# Patient Record
Sex: Male | Born: 1965 | Race: White | Hispanic: No | Marital: Married | Smoking: Never smoker
Health system: Southern US, Community
[De-identification: ages and names within clinical notes are randomized; demographics above are authoritative.]

## PROBLEM LIST (undated history)

## (undated) DIAGNOSIS — E538 Deficiency of other specified B group vitamins: Secondary | ICD-10-CM

## (undated) DIAGNOSIS — I10 Essential (primary) hypertension: Secondary | ICD-10-CM

## (undated) DIAGNOSIS — H348192 Central retinal vein occlusion, unspecified eye, stable: Secondary | ICD-10-CM

## (undated) DIAGNOSIS — R0789 Other chest pain: Secondary | ICD-10-CM

## (undated) DIAGNOSIS — H409 Unspecified glaucoma: Secondary | ICD-10-CM

## (undated) DIAGNOSIS — E042 Nontoxic multinodular goiter: Secondary | ICD-10-CM

## (undated) DIAGNOSIS — D649 Anemia, unspecified: Secondary | ICD-10-CM

## (undated) DIAGNOSIS — R7303 Prediabetes: Secondary | ICD-10-CM

---

## 2003-12-22 HISTORY — PX: COLONOSCOPY: SHX174

## 2006-08-11 ENCOUNTER — Emergency Department: Payer: Self-pay | Admitting: General Practice

## 2008-05-03 ENCOUNTER — Emergency Department: Payer: Self-pay | Admitting: Emergency Medicine

## 2008-05-03 ENCOUNTER — Other Ambulatory Visit: Payer: Self-pay

## 2012-10-18 ENCOUNTER — Other Ambulatory Visit: Payer: Self-pay | Admitting: Ophthalmology

## 2012-11-11 ENCOUNTER — Ambulatory Visit: Payer: Self-pay | Admitting: Oncology

## 2012-11-27 ENCOUNTER — Ambulatory Visit: Payer: Self-pay | Admitting: Oncology

## 2015-12-08 ENCOUNTER — Encounter: Payer: Self-pay | Admitting: Emergency Medicine

## 2015-12-08 ENCOUNTER — Emergency Department
Admission: EM | Admit: 2015-12-08 | Discharge: 2015-12-08 | Disposition: A | Discharge status: Home / Self Care | Payer: BLUE CROSS/BLUE SHIELD | Attending: Emergency Medicine | Admitting: Emergency Medicine

## 2015-12-08 DIAGNOSIS — Y998 Other external cause status: Secondary | ICD-10-CM | POA: Insufficient documentation

## 2015-12-08 DIAGNOSIS — Y9289 Other specified places as the place of occurrence of the external cause: Secondary | ICD-10-CM | POA: Diagnosis not present

## 2015-12-08 DIAGNOSIS — Z23 Encounter for immunization: Secondary | ICD-10-CM | POA: Insufficient documentation

## 2015-12-08 DIAGNOSIS — S61217A Laceration without foreign body of left little finger without damage to nail, initial encounter: Secondary | ICD-10-CM | POA: Insufficient documentation

## 2015-12-08 DIAGNOSIS — I10 Essential (primary) hypertension: Secondary | ICD-10-CM | POA: Diagnosis not present

## 2015-12-08 DIAGNOSIS — Y9389 Activity, other specified: Secondary | ICD-10-CM | POA: Diagnosis not present

## 2015-12-08 DIAGNOSIS — S61219A Laceration without foreign body of unspecified finger without damage to nail, initial encounter: Secondary | ICD-10-CM

## 2015-12-08 HISTORY — DX: Central retinal vein occlusion, unspecified eye, stable: H34.8192

## 2015-12-08 HISTORY — DX: Essential (primary) hypertension: I10

## 2015-12-08 MED ORDER — TETANUS-DIPHTH-ACELL PERTUSSIS 5-2.5-18.5 LF-MCG/0.5 IM SUSP
0.5000 mL | Freq: Once | INTRAMUSCULAR | Status: AC
  Administered 2015-12-08: 0.5 mL via INTRAMUSCULAR
  Filled 2015-12-08: qty 0.5
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
## 2015-12-08 NOTE — Discharge Instructions (Signed)
Laceration Care, Adult °A laceration is a cut that goes through all of the layers of the skin and into the tissue that is right under the skin. Some lacerations heal on their own. Others need to be closed with stitches (sutures), staples, skin adhesive strips, or skin glue. Proper laceration care minimizes the risk of infection and helps the laceration to heal better. °HOW TO CARE FOR YOUR LACERATION °If sutures or staples were used: °· Keep the wound clean and dry. °· If you were given a bandage (dressing), you should change it at least one time per day or as told by your health care provider. You should also change it if it becomes wet or dirty. °· Keep the wound completely dry for the first 24 hours or as told by your health care provider. After that time, you may shower or bathe. However, make sure that the wound is not soaked in water until after the sutures or staples have been removed. °· Clean the wound one time each day or as told by your health care provider: °¨ Wash the wound with soap and water. °¨ Rinse the wound with water to remove all soap. °¨ Pat the wound dry with a clean towel. Do not rub the wound. °· After cleaning the wound, apply a thin layer of antibiotic ointment as told by your health care provider. This will help to prevent infection and keep the dressing from sticking to the wound. °· Have the sutures or staples removed as told by your health care provider. °If skin adhesive strips were used: °· Keep the wound clean and dry. °· If you were given a bandage (dressing), you should change it at least one time per day or as told by your health care provider. You should also change it if it becomes dirty or wet. °· Do not get the skin adhesive strips wet. You may shower or bathe, but be careful to keep the wound dry. °· If the wound gets wet, pat it dry with a clean towel. Do not rub the wound. °· Skin adhesive strips fall off on their own. You may trim the strips as the wound heals. Do not  remove skin adhesive strips that are still stuck to the wound. They will fall off in time. °If skin glue was used: °· Try to keep the wound dry, but you may briefly wet it in the shower or bath. Do not soak the wound in water, such as by swimming. °· After you have showered or bathed, gently pat the wound dry with a clean towel. Do not rub the wound. °· Do not do any activities that will make you sweat heavily until the skin glue has fallen off on its own. °· Do not apply liquid, cream, or ointment medicine to the wound while the skin glue is in place. Using those may loosen the film before the wound has healed. °· If you were given a bandage (dressing), you should change it at least one time per day or as told by your health care provider. You should also change it if it becomes dirty or wet. °· If a dressing is placed over the wound, be careful not to apply tape directly over the skin glue. Doing that may cause the glue to be pulled off before the wound has healed. °· Do not pick at the glue. The skin glue usually remains in place for 5-10 days, then it falls off of the skin. °General Instructions °· Take over-the-counter and prescription   medicines only as told by your health care provider. °· If you were prescribed an antibiotic medicine or ointment, take or apply it as told by your doctor. Do not stop using it even if your condition improves. °· To help prevent scarring, make sure to cover your wound with sunscreen whenever you are outside after stitches are removed, after adhesive strips are removed, or when glue remains in place and the wound is healed. Make sure to wear a sunscreen of at least 30 SPF. °· Do not scratch or pick at the wound. °· Keep all follow-up visits as told by your health care provider. This is important. °· Check your wound every day for signs of infection. Watch for: °¨ Redness, swelling, or pain. °¨ Fluid, blood, or pus. °· Raise (elevate) the injured area above the level of your heart  while you are sitting or lying down, if possible. °SEEK MEDICAL CARE IF: °· You received a tetanus shot and you have swelling, severe pain, redness, or bleeding at the injection site. °· You have a fever. °· A wound that was closed breaks open. °· You notice a bad smell coming from your wound or your dressing. °· You notice something coming out of the wound, such as wood or glass. °· Your pain is not controlled with medicine. °· You have increased redness, swelling, or pain at the site of your wound. °· You have fluid, blood, or pus coming from your wound. °· You notice a change in the color of your skin near your wound. °· You need to change the dressing frequently due to fluid, blood, or pus draining from the wound. °· You develop a new rash. °· You develop numbness around the wound. °SEEK IMMEDIATE MEDICAL CARE IF: °· You develop severe swelling around the wound. °· Your pain suddenly increases and is severe. °· You develop painful lumps near the wound or on skin that is anywhere on your body. °· You have a red streak going away from your wound. °· The wound is on your hand or foot and you cannot properly move a finger or toe. °· The wound is on your hand or foot and you notice that your fingers or toes look pale or bluish. °  °This information is not intended to replace advice given to you by your health care provider. Make sure you discuss any questions you have with your health care provider. °  °Document Released: 10/13/2005 Document Revised: 02/27/2015 Document Reviewed: 10/09/2014 °Elsevier Interactive Patient Education ©2016 [employer] ° °Nonsutured Laceration Care °A laceration is a cut that goes through all layers of the skin and extends into the tissue that is right under the skin. This type of cut is usually stitched up (sutured) or closed with tape (adhesive strips) or skin glue shortly after the injury happens. °However, if the wound is dirty or if several hours pass before medical treatment is  provided, it is likely that germs (bacteria) will enter the wound. Closing a laceration after bacteria have entered it increases the risk of infection. In these cases, your health care provider may leave the laceration open (nonsutured) and cover it with a bandage. This type of treatment helps prevent infection and allows the wound to heal from the deepest layer of tissue damage up to the surface. °An open fracture is a type of injury that may involve nonsutured lacerations. An open fracture is a break in a bone that happens along with one or more lacerations through the skin that   is near the fracture site. °HOW TO CARE FOR YOUR NONSUTURED LACERATION °· Take or apply over-the-counter and prescription medicines only as told by your health care provider. °· If you were prescribed an antibiotic medicine, take or apply it as told by your health care provider. Do not stop using the antibiotic even if your condition improves. °· Clean the wound one time each day or as told by your health care provider. °¨ Wash the wound with mild soap and water. °¨ Rinse the wound with water to remove all soap. °¨ Pat your wound dry with a clean towel. Do not rub the wound. °· Do not inject anything into the wound unless your health care provider told you to. °· Change any bandages (dressings) as told by your health care provider. This includes changing the dressing if it gets wet, dirty, or starts to smell bad. °· Keep the dressing dry until your health care provider says it can be removed. Do not take baths, swim, or do anything that puts your wound underwater until your health care provider approves. °· Raise (elevate) the injured area above the level of your heart while you are sitting or lying down, if possible. °· Do not scratch or pick at the wound. °· Check your wound every day for signs of infection. Watch for: °¨ Redness, swelling, or pain. °¨ Fluid, blood, or pus. °· Keep all follow-up visits as told by your health care  provider. This is important. °SEEK MEDICAL CARE IF: °· You received a tetanus and shot and you have swelling, severe pain, redness, or bleeding at the injection site.   °· You have a fever. °· Your pain is not controlled with medicine. °· You have increased redness, swelling, or pain at the site of your wound. °· You have fluid, blood, or pus coming from your wound. °· You notice a bad smell coming from your wound or your dressing. °· You notice something coming out of the wound, such as wood or glass. °· You notice a change in the color of your skin near your wound. °· You develop a new rash. °· You need to change the dressing frequently due to fluid, blood, or pus draining from the wound. °· You develop numbness around your wound. °SEEK IMMEDIATE MEDICAL CARE IF: °· Your pain suddenly increases and is severe. °· You develop severe swelling around the wound. °· The wound is on your hand or foot and you cannot properly move a finger or toe. °· The wound is on your hand or foot and you notice that your fingers or toes look pale or bluish. °· You have a red streak going away from your wound. °  °This information is not intended to replace advice given to you by your health care provider. Make sure you discuss any questions you have with your health care provider. °  °Document Released: 09/10/2006 Document Revised: 02/27/2015 Document Reviewed: 10/09/2014 °Elsevier Interactive Patient Education ©2016 Elsevier Inc. ° °
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
## 2016-10-08 HISTORY — PX: COLONOSCOPY: SHX174

## 2019-07-26 ENCOUNTER — Ambulatory Visit: Payer: Self-pay | Admitting: Cardiology

## 2019-07-26 ENCOUNTER — Other Ambulatory Visit: Payer: Self-pay

## 2019-09-27 ENCOUNTER — Other Ambulatory Visit: Payer: Self-pay | Admitting: Student

## 2019-09-27 DIAGNOSIS — R17 Unspecified jaundice: Secondary | ICD-10-CM

## 2019-10-10 ENCOUNTER — Ambulatory Visit: Payer: Self-pay

## 2019-11-07 ENCOUNTER — Ambulatory Visit
Admission: RE | Admit: 2019-11-07 | Discharge: 2019-11-07 | Disposition: A | Discharge status: Home / Self Care | Payer: Self-pay | Source: Ambulatory Visit | Attending: Student | Admitting: Student

## 2019-11-07 ENCOUNTER — Other Ambulatory Visit: Payer: Self-pay

## 2019-11-07 DIAGNOSIS — R17 Unspecified jaundice: Secondary | ICD-10-CM | POA: Insufficient documentation

## 2020-02-28 ENCOUNTER — Encounter: Payer: Self-pay | Admitting: Cardiovascular Disease

## 2020-02-28 ENCOUNTER — Other Ambulatory Visit: Payer: Self-pay

## 2020-02-28 ENCOUNTER — Ambulatory Visit (INDEPENDENT_AMBULATORY_CARE_PROVIDER_SITE_OTHER): Payer: 59 | Admitting: Cardiovascular Disease

## 2020-02-28 VITALS — BP 148/100 | HR 78 | Ht 67.0 in | Wt 170.0 lb

## 2020-02-28 DIAGNOSIS — Z720 Tobacco use: Secondary | ICD-10-CM

## 2020-02-28 DIAGNOSIS — R072 Precordial pain: Secondary | ICD-10-CM | POA: Diagnosis not present

## 2020-02-28 DIAGNOSIS — I10 Essential (primary) hypertension: Secondary | ICD-10-CM

## 2020-02-28 MED ORDER — METOPROLOL TARTRATE 100 MG PO TABS
ORAL_TABLET | ORAL | 0 refills | Status: DC
Start: 2020-02-28 — End: 2020-04-06

## 2020-02-28 NOTE — Progress Notes (Signed)
Cardiology Office Note   Date:  02/29/2020   ID:  Peter Stanton, DOB 1966/10/01, MRN [id number]  PCP:  Maryland Pink, MD  Cardiologist:   Kathlyn Sacramento, MD   Chief Complaint  Patient presents with  . OTHER    Chest pain. Meds reviewed verbally with pt.      History of Present Illness: Peter Stanton is a 54 y.o. male who is self-referred for evaluation of chest pain.  He has no prior cardiac history and had previous stress test in 2019 which was normal.  He has known history of essential hypertension that has not been optimally controlled recently.  He is not diabetic.  He does not smoke cigarettes  but does dip tobacco.  He does not know the family history on his father's side.  Over the last 2 years, he has experienced intermittent left-sided chest pain described as pressure and squeezing sensation which can last anywhere from few minutes to few days but is usually mild.  It can happen at rest or with exertion.  No significant shortness of breath.  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  He was seen by a cardiologist (Dr. Pasi) in October of last year at [facility].  CTA of the coronary arteries was ordered.  However, the patient's insurance changed and he could not get the test done.    Past Medical History:  Diagnosis Date  . Hypertension   . Retinal vein occlusion     History reviewed. No pertinent surgical history.   Current Outpatient Medications  Medication Sig Dispense Refill  . amLODipine (NORVASC) 5 MG tablet Take 5 mg by mouth daily.    Marland Kitchen aspirin 81 MG EC tablet Take by mouth daily.     Marland Kitchen EPINEPHrine 0.3 mg/0.3 mL IJ SOAJ injection as needed.    . varenicline (CHANTIX PAK) 0.5 MG X 11 & 1 MG X 42 tablet Follow package directions.    . metoprolol tartrate (LOPRESSOR) 100 MG tablet Take 1 tablet (100 mg) by mouth 2 hours prior to Cardiac CTA 1 tablet 0   No current facility-administered medications for this visit.     Allergies:   Patient has no known allergies.    Social History:  The patient  reports that he has never smoked. His smokeless tobacco use includes chew. He reports current alcohol use. He reports that he does not use drugs.   Family History:  The patient does not no family history on his father's side.  No family history of heart disease on his mother side.   ROS:  Please see the history of present illness.   Otherwise, review of systems are positive for none.   All other systems are reviewed and negative.    PHYSICAL EXAM: VS:  BP (!) 148/100 (BP Location: Right Arm, Patient Position: Sitting, Cuff Size: Normal)   Pulse 78   Ht 5\' 7"  (1.702 m)   Wt 170 lb (77.1 kg)   SpO2 99%   BMI 26.63 kg/m  , BMI Body mass index is 26.63 kg/m. GEN: Well nourished, well developed, in no acute distress  HEENT: normal  Neck: no JVD, carotid bruits, or masses Cardiac: RRR; no murmurs, rubs, or gallops,no edema  Respiratory:  clear to auscultation bilaterally, normal work of breathing GI: soft, nontender, nondistended, + BS MS: no deformity or atrophy  Skin: warm and dry, no rash Neuro:  Strength and sensation are intact Psych: euthymic  mood, full affect   EKG:  EKG is ordered today. The ekg ordered today demonstrates normal sinus rhythm with no significant ST or T wave changes.   Recent Labs: No results found for requested labs within last 8760 hours.    Lipid Panel No results found for: CHOL, TRIG, HDL, CHOLHDL, VLDL, LDLCALC, LDLDIRECT    Wt Readings from Last 3 Encounters:  02/28/20 170 lb (77.1 kg)  12/08/15 165 lb (74.8 kg)       PAD Screen 02/28/2020  Previous PAD dx? No  Previous surgical procedure? No  Pain with walking? No  Feet/toe relief with dangling? No  Painful, non-healing ulcers? No  Extremities discolored? No      ASSESSMENT AND PLAN:  1.  Chest pain: Atypical overall but he does have risk factors including gender, uncontrolled hypertension and  tobacco use.  Fortunately, his EKG does not show any ischemic changes.  I recommend evaluation with CTA of the coronary arteries with FFR if needed.  2.  Essential hypertension: Blood pressure is not controlled on amlodipine 5 mg daily.  He reports being on lisinopril in the past but it was switched to amlodipine.  I suspect that he will require adding a second antihypertensive medication and this will be addressed after his CT result.  3.  Tobacco use: He does not smoke but continues to dip.  Discussed the importance of complete cessation.    Disposition:   FU with me in 1 month  Signed,  Lorine Bears, MD  02/29/2020 4:09 PM    [facility]

## 2020-02-28 NOTE — Patient Instructions (Signed)
Medication Instructions:  Your physician recommends that you continue on your current medications as directed. Please refer to the Current Medication list given to you today.   A one time dose of Metoprolol 100mg  to be taken 2 hours prior to your Cardiac CTA has been sent to your pharmacy.  *If you need a refill on your cardiac medications before your next appointment, please call your pharmacy*   Lab Work: None ordered If you have labs (blood work) drawn today and your tests are completely normal, you will receive your results only by: Marland Kitchen MyChart Message (if you have MyChart) OR . A paper copy in the mail If you have any lab test that is abnormal or we need to change your treatment, we will call you to review the results.   Testing/Procedures: Your physician has requested that you have cardiac CT. Cardiac computed tomography (CT) is a painless test that uses an x-ray machine to take clear, detailed pictures of your heart. For further information please visit [email or web address].[email or web address]. Please follow instruction sheet as given.      Follow-Up: At [facility], you and your health needs are our priority.  As part of our continuing mission to provide you with exceptional heart care, we have created designated Provider Care Teams.  These Care Teams include your primary Cardiologist (physician) and Advanced Practice Providers (APPs -  Physician Assistants and Nurse Practitioners) who all work together to provide you with the care you need, when you need it.  We recommend signing up for the patient portal called "MyChart".  Sign up information is provided on this After Visit Summary.  MyChart is used to connect with patients for Virtual Visits (Telemedicine).  Patients are able to view lab/test results, encounter notes, upcoming appointments, etc.  Non-urgent messages can be sent to your provider as well.   To learn more about what you can do with MyChart, go to [email or web address].    Your  next appointment:   4 week(s)  The format for your next appointment:   In Person  Provider:    You may see Dr. Fletcher Anon or one of the following Advanced Practice Providers on your designated Care Team:    Murray Hodgkins, NP  Christell Faith, PA-C  Marrianne Mood, PA-C    Other Instructions Your cardiac CT will be scheduled at one of the below locations:   [facility] [address] [place], [place] [address] [phone]  [place] [address] [place], [place] [address] [phone]  If scheduled at [facility], please arrive at the [facility] main entrance of [facility] 30 minutes prior to test start time. Proceed to the [facility] Radiology Department (first floor) to check-in and test prep.  If scheduled at [facility], please arrive 15 mins early for check-in and test prep.  Please follow these instructions carefully (unless otherwise directed):  Hold all erectile dysfunction medications at least 3 days (72 hrs) prior to test.  On the Night Before the Test: . Be sure to Drink plenty of water. . Do not consume any caffeinated/decaffeinated beverages or chocolate 12 hours prior to your test. . Do not take any antihistamines 12 hours prior to your test. I On the Day of the Test: . Drink plenty of water. Do not drink any water within one hour of the test. . Do not eat any food 4 hours prior to the test. . You may take your  regular medications prior to the test.  . Take metoprolol (Lopressor) two hours prior to test.        After the Test: . Drink plenty of water. . After receiving IV contrast, you may experience a mild flushed feeling. This is normal. . On occasion, you may experience a mild rash up to 24 hours after the test. This is not dangerous. If this occurs, you can take Benadryl 25 mg and increase your fluid intake. . If you experience trouble  breathing, this can be serious. If it is severe call 911 IMMEDIATELY. If it is mild, please call our office.   Once we have confirmed authorization from your insurance company, we will call you to set up a date and time for your test.   For non-scheduling related questions, please contact the cardiac imaging nurse navigator should you have any questions/concerns: Rockwell Alexandria, RN Navigator Cardiac Imaging [facility] [phone] office  For scheduling needs, including cancellations and rescheduling, please call [phone].

## 2020-03-27 ENCOUNTER — Ambulatory Visit: Payer: 59 | Admitting: Cardiovascular Disease

## 2020-03-28 ENCOUNTER — Telehealth (HOSPITAL_COMMUNITY): Payer: Self-pay | Admitting: *Deleted

## 2020-03-28 NOTE — Telephone Encounter (Signed)
Attempted to call patient regarding upcoming cardiac CT appointment. Left message on voicemail with name and callback number  Wade Asebedo Tai RN Navigator Cardiac Imaging [facility] [phone] Office [phone] Cell  

## 2020-03-29 ENCOUNTER — Ambulatory Visit
Admission: RE | Admit: 2020-03-29 | Discharge: 2020-03-29 | Disposition: A | Discharge status: Home / Self Care | Payer: 59 | Source: Ambulatory Visit | Attending: Cardiovascular Disease | Admitting: Cardiovascular Disease

## 2020-03-29 ENCOUNTER — Other Ambulatory Visit: Payer: Self-pay

## 2020-03-29 DIAGNOSIS — R072 Precordial pain: Secondary | ICD-10-CM | POA: Insufficient documentation

## 2020-03-29 LAB — POCT I-STAT CREATININE: Creatinine, Ser: 1.1 mg/dL (ref 0.61–1.24)

## 2020-03-29 MED ORDER — NITROGLYCERIN 0.4 MG SL SUBL
0.8000 mg | SUBLINGUAL_TABLET | Freq: Once | SUBLINGUAL | Status: AC
  Administered 2020-03-29: 0.8 mg via SUBLINGUAL

## 2020-03-29 MED ORDER — IOHEXOL 350 MG/ML SOLN
75.0000 mL | Freq: Once | INTRAVENOUS | Status: AC | PRN
  Administered 2020-03-29: 75 mL via INTRAVENOUS

## 2020-03-29 MED ORDER — METOPROLOL TARTRATE 5 MG/5ML IV SOLN
10.0000 mg | Freq: Once | INTRAVENOUS | Status: AC
  Administered 2020-03-29: 10 mg via INTRAVENOUS

## 2020-03-29 NOTE — Progress Notes (Signed)
Patient tolerated CT well. Ate peanut butter crackers and drank water after. Ambulatory steady gait to exit.

## 2020-03-30 ENCOUNTER — Telehealth: Payer: Self-pay | Admitting: Cardiovascular Disease

## 2020-03-30 NOTE — Telephone Encounter (Signed)
Returned the call to [facility]. They were calling regarding the patients recent Cardiac CTA results. Advised them that the Radiologist impressions is available for review in the patients charts. I will note and fwd the update to the patient cardiologist Dr. Kirke Corin.   Reticulonodular opacities and patchy airspace opacity in the medial left lower lobe, suspicious for infectious or inflammatory etiology. Recommend clinical correlation, and consider chest radiograph or chest CT for further evaluation.

## 2020-03-30 NOTE — Telephone Encounter (Signed)
[facility] radiology calling to report results ot CT.

## 2020-04-03 NOTE — Telephone Encounter (Signed)
See result note.  

## 2020-04-04 NOTE — Telephone Encounter (Signed)
Patient made aware of [facility] CTA results and Dr. Jari Sportsman recommendation with verbalized understanding.

## 2020-04-04 NOTE — Telephone Encounter (Signed)
Called to give the patient Cardiac CTA. lmtcb.

## 2020-04-04 NOTE — Telephone Encounter (Signed)
-----   Message from Iran Ouch, MD sent at 04/03/2020  5:49 PM EDT ----- Inform patient that cardiac CTA showed no significant coronary artery disease.  This is overall good news.  The CT did show some shadowing in the left lower lung of unclear etiology.  He should keep his follow-up appointment with me this week to see if he has any symptoms related to this.

## 2020-04-04 NOTE — Telephone Encounter (Signed)
Patient returning call.

## 2020-04-06 ENCOUNTER — Encounter: Payer: Self-pay | Admitting: Cardiovascular Disease

## 2020-04-06 ENCOUNTER — Other Ambulatory Visit: Payer: Self-pay

## 2020-04-06 ENCOUNTER — Ambulatory Visit (INDEPENDENT_AMBULATORY_CARE_PROVIDER_SITE_OTHER): Payer: 59 | Admitting: Cardiovascular Disease

## 2020-04-06 VITALS — BP 160/90 | HR 80 | Ht 67.0 in | Wt 168.1 lb

## 2020-04-06 DIAGNOSIS — I10 Essential (primary) hypertension: Secondary | ICD-10-CM

## 2020-04-06 DIAGNOSIS — E785 Hyperlipidemia, unspecified: Secondary | ICD-10-CM

## 2020-04-06 DIAGNOSIS — R072 Precordial pain: Secondary | ICD-10-CM | POA: Diagnosis not present

## 2020-04-06 MED ORDER — LOSARTAN POTASSIUM 50 MG PO TABS
50.0000 mg | ORAL_TABLET | Freq: Every day | ORAL | 6 refills | Status: DC
Start: 2020-04-06 — End: 2020-09-27

## 2020-04-06 NOTE — Progress Notes (Signed)
Cardiology Office Note   Date:  04/06/2020   ID:  Peter Stanton, DOB 09-Jan-1966, MRN [id number]  PCP:  Jerl Mina, MD  Cardiologist:   Lorine Bears, MD   Chief Complaint  Patient presents with  . office visit    Meds verbally reviewed w/ pt.      History of Present Illness: Peter Stanton is a 54 y.o. male who is here today for follow-up with regarding chest pain.  He has no prior cardiac history and had previous stress test in 2019 which was normal.  He has known history of essential hypertension that has not been optimally controlled recently.  He is not diabetic.  He does not smoke cigarettes  but does dip tobacco.  He was seen recently for atypical chest pain.  He underwent CTA of the coronary arteries which showed minimally elevated calcium score of 0.77 with near normal coronary arteries with no evidence of obstructive disease.  The CT did show incidental finding of reticulonodular opacities and patchy airspace disease in the medial left lower lobe suspicious for infectious or inflammatory etiology.  The patient reports that recently he had a bad sinus infection with associated bronchitis.  He had productive cough for about 3 weeks and was placed on a 10-day course of prednisone.  His symptoms resolved completely and currently he has no residual symptoms.    Past Medical History:  Diagnosis Date  . Hypertension   . Retinal vein occlusion     History reviewed. No pertinent surgical history.   Current Outpatient Medications  Medication Sig Dispense Refill  . amLODipine (NORVASC) 5 MG tablet Take 5 mg by mouth daily.    Marland Kitchen aspirin 81 MG EC tablet Take by mouth daily.     Marland Kitchen EPINEPHrine 0.3 mg/0.3 mL IJ SOAJ injection as needed.    . varenicline (CHANTIX PAK) 0.5 MG X 11 & 1 MG X 42 tablet As needed     No current facility-administered medications for this visit.    Allergies:   Other and Meloxicam    Social History:  The patient  reports that he has never smoked.  His smokeless tobacco use includes chew. He reports current alcohol use. He reports that he does not use drugs.   Family History:  The patient does not no family history on his father's side.  No family history of heart disease on his mother side.   ROS:  Please see the history of present illness.   Otherwise, review of systems are positive for none.   All other systems are reviewed and negative.    PHYSICAL EXAM: VS:  BP (!) 160/90 (BP Location: Left Arm, Patient Position: Sitting, Cuff Size: Normal)   Pulse 80   Ht 5\' 7"  (1.702 m)   Wt 168 lb 2 oz (76.3 kg)   SpO2 99%   BMI 26.33 kg/m  , BMI Body mass index is 26.33 kg/m. GEN: Well nourished, well developed, in no acute distress  HEENT: normal  Neck: no JVD, carotid bruits, or masses Cardiac: RRR; no murmurs, rubs, or gallops,no edema  Respiratory:  clear to auscultation bilaterally, normal work of breathing GI: soft, nontender, nondistended, + BS MS: no deformity or atrophy  Skin: warm and dry, no rash Neuro:  Strength and sensation are intact Psych: euthymic mood, full affect   EKG:  EKG is not ordered today.    Recent Labs: 03/29/2020: Creatinine, Ser 1.10    Lipid Panel No results found for: CHOL,  TRIG, HDL, CHOLHDL, VLDL, LDLCALC, LDLDIRECT    Wt Readings from Last 3 Encounters:  04/06/20 168 lb 2 oz (76.3 kg)  02/28/20 170 lb (77.1 kg)  12/08/15 165 lb (74.8 kg)       PAD Screen 02/28/2020  Previous PAD dx? No  Previous surgical procedure? No  Pain with walking? No  Feet/toe relief with dangling? No  Painful, non-healing ulcers? No  Extremities discolored? No      ASSESSMENT AND PLAN:  1.  Chest pain: Likely musculoskeletal.  Recent CTA of the coronary arteries showed near normal arteries with no evidence of obstructive disease.  No further cardiac work-up is recommended.    2.  Essential hypertension: Blood pressure is not controlled on amlodipine 5 mg daily.  He reports being on lisinopril in the  past with no major side effects.  He did have intermittent dry cough for the.  I elected to start him today on losartan 50 mg daily.  His labs from today shows normal renal function and electrolytes.  3.  Tobacco use: He does not smoke but continues to dip.   4.  Mild hyperlipidemia: I reviewed his lipid profile from today which showed an LDL of 136.  His HDL is elevated at 60.  Given that CTA did not show significant coronary artery disease, I do not think medications are needed.  Continue with healthy lifestyle changes.  5.  Abnormal CT lungs: Patchy airspace disease noted in the left lower lobe.  However, by today's physical exam, his lungs are completely clear.  In addition, all his symptoms of bronchitis have resolved after he received prednisone.  I do not think this requires further follow-up but I will forward to Dr. Kary Kos for his opinion.  The patient is going to follow-up with him next week.   Disposition:   FU with me in 6 months  Signed,  Kathlyn Sacramento, MD  04/06/2020 4:35 PM    [facility]

## 2020-04-06 NOTE — Patient Instructions (Signed)
Medication Instructions:  - Your physician has recommended you make the following change in your medication:   1) START losartan 50 mg- take 1 tablet by mouth once daily   *If you need a refill on your cardiac medications before your next appointment, please call your pharmacy*   Lab Work: - none ordered  If you have labs (blood work) drawn today and your tests are completely normal, you will receive your results only by: Marland Kitchen MyChart Message (if you have MyChart) OR . A paper copy in the mail If you have any lab test that is abnormal or we need to change your treatment, we will call you to review the results.   Testing/Procedures: - none ordered   Follow-Up: At [facility], you and your health needs are our priority.  As part of our continuing mission to provide you with exceptional heart care, we have created designated Provider Care Teams.  These Care Teams include your primary Cardiologist (physician) and Advanced Practice Providers (APPs -  Physician Assistants and Nurse Practitioners) who all work together to provide you with the care you need, when you need it.  We recommend signing up for the patient portal called "MyChart".  Sign up information is provided on this After Visit Summary.  MyChart is used to connect with patients for Virtual Visits (Telemedicine).  Patients are able to view lab/test results, encounter notes, upcoming appointments, etc.  Non-urgent messages can be sent to your provider as well.   To learn more about what you can do with MyChart, go to [email or web address].    Your next appointment:   6 month(s)  The format for your next appointment:   In Person  Provider:    You may see Lorine Bears, MD or one of the following Advanced Practice Providers on your designated Care Team:    Nicolasa Ducking, NP  Eula Listen, PA-C  Marisue Ivan, PA-C    Other Instructions n/a

## 2020-04-22 ENCOUNTER — Encounter: Payer: Self-pay | Admitting: Emergency Medicine

## 2020-04-22 ENCOUNTER — Other Ambulatory Visit: Payer: Self-pay

## 2020-04-22 DIAGNOSIS — F17220 Nicotine dependence, chewing tobacco, uncomplicated: Secondary | ICD-10-CM | POA: Insufficient documentation

## 2020-04-22 DIAGNOSIS — I10 Essential (primary) hypertension: Secondary | ICD-10-CM | POA: Insufficient documentation

## 2020-04-22 DIAGNOSIS — Y92482 Bike path as the place of occurrence of the external cause: Secondary | ICD-10-CM | POA: Insufficient documentation

## 2020-04-22 DIAGNOSIS — Y9355 Activity, bike riding: Secondary | ICD-10-CM | POA: Insufficient documentation

## 2020-04-22 DIAGNOSIS — Y998 Other external cause status: Secondary | ICD-10-CM | POA: Diagnosis not present

## 2020-04-22 DIAGNOSIS — Z7982 Long term (current) use of aspirin: Secondary | ICD-10-CM | POA: Diagnosis not present

## 2020-04-22 DIAGNOSIS — Z79899 Other long term (current) drug therapy: Secondary | ICD-10-CM | POA: Diagnosis not present

## 2020-04-22 DIAGNOSIS — S81012A Laceration without foreign body, left knee, initial encounter: Secondary | ICD-10-CM | POA: Insufficient documentation
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
## 2020-04-22 NOTE — ED Notes (Signed)
Patient to stat desk in no acute distress asking about wait time. Patient given update on wait time. Patient verbalizes understanding.  

## 2020-04-23 ENCOUNTER — Emergency Department: Payer: 59

## 2020-04-23 ENCOUNTER — Encounter: Payer: Self-pay | Admitting: Radiology

## 2020-04-23 ENCOUNTER — Emergency Department
Admission: EM | Admit: 2020-04-23 | Discharge: 2020-04-23 | Disposition: A | Discharge status: Home / Self Care | Payer: 59 | Attending: Emergency Medicine | Admitting: Emergency Medicine

## 2020-04-23 DIAGNOSIS — S81012A Laceration without foreign body, left knee, initial encounter: Secondary | ICD-10-CM

## 2020-04-23 MED ORDER — LIDOCAINE-EPINEPHRINE 2 %-1:100000 IJ SOLN
20.0000 mL | Freq: Once | INTRAMUSCULAR | Status: AC
  Administered 2020-04-23: 20 mL via INTRADERMAL
  Filled 2020-04-23: qty 1

## 2020-04-23 MED ORDER — TETANUS-DIPHTH-ACELL PERTUSSIS 5-2.5-18.5 LF-MCG/0.5 IM SUSP
0.5000 mL | Freq: Once | INTRAMUSCULAR | Status: AC
  Administered 2020-04-23: 0.5 mL via INTRAMUSCULAR
  Filled 2020-04-23: qty 0.5
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
## 2020-04-23 NOTE — ED Notes (Signed)
MD at bedside for laceration repair.

## 2020-04-23 NOTE — Discharge Instructions (Addendum)
Patient have your sutures removed in 7 to 10 days.  Keep an eye on the area. If you start to developing any redness he should return to the ER to get antibiotics however at this time there is no signs of any foreign bodies.  X-ray was negative for fracture.

## 2020-09-26 ENCOUNTER — Other Ambulatory Visit: Payer: Self-pay | Admitting: Cardiovascular Disease

## 2020-09-26 NOTE — Telephone Encounter (Signed)
Attempted to schedule no ans no vm  

## 2020-09-26 NOTE — Telephone Encounter (Signed)
Please schedule F/U appointment. Thank you! 

## 2020-09-27 NOTE — Telephone Encounter (Signed)
Patient wants to wait until February to schedule .

## 2020-09-27 NOTE — Telephone Encounter (Signed)
No need for labs.  He had full labs done with his primary care physician in June and I was able to review them.

## 2020-09-27 NOTE — Telephone Encounter (Signed)
Spoke with patient .  He was hesitant to schedule visit .  He stated he was not sure why he would need an appt to have routine bp meds he needs to take everyday.    Discussed with patient POC was to fu in December for ROV.  Also discussed option to call pcp if not wanting to continue with cards.    Patient still hesitant and required further explanation / discussion.   Explained for medications to be prescribed safely sometimes in person exam / labs / ekgs are required for the patients well being .    Patient states explanation makes sense but he would like to see if labs are needed  And if these can be obtained prior to ov  .    Please advise .

## 2020-09-27 NOTE — Telephone Encounter (Signed)
Losartan 50 mg qd refilled #90 R-0.  Msg fwd to Dr. Kirke Corin to advise if lab work is needed prior to the patients next f/u appt.

## 2020-09-27 NOTE — Telephone Encounter (Signed)
LVM for patient to call back. ?

## 2020-12-20 ENCOUNTER — Encounter: Payer: Self-pay | Admitting: Cardiovascular Disease

## 2020-12-20 ENCOUNTER — Other Ambulatory Visit: Payer: Self-pay

## 2020-12-20 ENCOUNTER — Ambulatory Visit (INDEPENDENT_AMBULATORY_CARE_PROVIDER_SITE_OTHER): Payer: 59 | Admitting: Cardiovascular Disease

## 2020-12-20 VITALS — BP 140/90 | HR 76 | Ht 67.0 in | Wt 167.0 lb

## 2020-12-20 DIAGNOSIS — I10 Essential (primary) hypertension: Secondary | ICD-10-CM | POA: Diagnosis not present

## 2020-12-20 DIAGNOSIS — R079 Chest pain, unspecified: Secondary | ICD-10-CM

## 2020-12-20 DIAGNOSIS — Z720 Tobacco use: Secondary | ICD-10-CM

## 2020-12-20 DIAGNOSIS — E785 Hyperlipidemia, unspecified: Secondary | ICD-10-CM

## 2020-12-20 DIAGNOSIS — R9389 Abnormal findings on diagnostic imaging of other specified body structures: Secondary | ICD-10-CM

## 2020-12-20 NOTE — Patient Instructions (Signed)
Medication Instructions:  Your physician recommends that you continue on your current medications as directed. Please refer to the Current Medication list given to you today.  *If you need a refill on your cardiac medications before your next appointment, please call your pharmacy*   Lab Work: None ordered If you have labs (blood work) drawn today and your tests are completely normal, you will receive your results only by: Marland Kitchen MyChart Message (if you have MyChart) OR . A paper copy in the mail If you have any lab test that is abnormal or we need to change your treatment, we will call you to review the results.   Testing/Procedures: Your physician has requested that you have an echocardiogram. Echocardiography is a painless test that uses sound waves to create images of your heart. It provides your doctor with information about the size and shape of your heart and how well your heart's chambers and valves are working. This procedure takes approximately one hour. There are no restrictions for this procedure.  Non-Cardiac CT scanning, (CAT scanning), is a noninvasive, special x-ray that produces cross-sectional images of the body using x-rays and a computer. CT scans help physicians diagnose and treat medical conditions. For some CT exams, a contrast material is used to enhance visibility in the area of the body being studied. CT scans provide greater clarity and reveal more details than regular x-ray exams.  --Please call [phone] to schedule your Chest CT   Follow-Up: At [facility], you and your health needs are our priority.  As part of our continuing mission to provide you with exceptional heart care, we have created designated Provider Care Teams.  These Care Teams include your primary Cardiologist (physician) and Advanced Practice Providers (APPs -  Physician Assistants and Nurse Practitioners) who all work together to provide you with the care you need, when you need it.  We  recommend signing up for the patient portal called "MyChart".  Sign up information is provided on this After Visit Summary.  MyChart is used to connect with patients for Virtual Visits (Telemedicine).  Patients are able to view lab/test results, encounter notes, upcoming appointments, etc.  Non-urgent messages can be sent to your provider as well.   To learn more about what you can do with MyChart, go to [email or web address].    Your next appointment:   6 month(s)  The format for your next appointment:   In Person  Provider:   You may see  Lorine Bears, MD or one of the following Advanced Practice Providers on your designated Care Team:    Nicolasa Ducking, NP  Eula Listen, PA-C  Marisue Ivan, PA-C  Cadence [place], [place]  Gillian Shields, NP    Other Instructions N/A

## 2020-12-20 NOTE — Progress Notes (Signed)
Cardiology Office Note   Date:  12/20/2020   ID:  Peter Stanton, DOB 01-31-1966, MRN [id number]  PCP:  Jerl Mina, MD  Cardiologist:   Lorine Bears, MD   Chief Complaint  Patient presents with  . 6 month follow     "doing well." Medications reviewed by the patient verbally.       History of Present Illness: Peter Stanton is a 55 y.o. male who is here today for follow-up with regarding chest pain.  He has no prior cardiac history and had previous stress test in 2019 which was normal.  He has known history of essential hypertension that has not been optimally controlled recently.  He is not diabetic.  He does not smoke cigarettes  but does dip tobacco.  He was seen last year for atypical chest pain.  He underwent CTA of the coronary arteries in June which showed minimally elevated calcium score of 0.77 with near normal coronary arteries with no evidence of obstructive disease.  The CT did show incidental finding of reticulonodular opacities and patchy airspace disease in the medial left lower lobe suspicious for infectious or inflammatory etiology.    He has been doing reasonably well overall but reports continued symptoms of localized chest pain in the left side. This is not exertional. He continues to exercise on regular basis with no symptoms.   Past Medical History:  Diagnosis Date  . Hypertension   . Retinal vein occlusion     History reviewed. No pertinent surgical history.   Current Outpatient Medications  Medication Sig Dispense Refill  . amLODipine (NORVASC) 5 MG tablet Take 5 mg by mouth daily.    Marland Kitchen aspirin 81 MG EC tablet Take by mouth daily.     Marland Kitchen EPINEPHrine 0.3 mg/0.3 mL IJ SOAJ injection as needed.    Marland Kitchen losartan (COZAAR) 50 MG tablet TAKE 1 TABLET BY MOUTH EVERY DAY 90 tablet 0  . brimonidine (ALPHAGAN) 0.2 % ophthalmic solution 1 drop 2 (two) times daily.    Marland Kitchen latanoprost (XALATAN) 0.005 % ophthalmic solution 1 drop at bedtime.     No current  facility-administered medications for this visit.    Allergies:   Other and Meloxicam    Social History:  The patient  reports that he has never smoked. His smokeless tobacco use includes chew. He reports current alcohol use of about 12.0 standard drinks of alcohol per week. He reports that he does not use drugs.   Family History:  The patient does not no family history on his father's side.  No family history of heart disease on his mother side.   ROS:  Please see the history of present illness.   Otherwise, review of systems are positive for none.   All other systems are reviewed and negative.    PHYSICAL EXAM: VS:  BP 140/90 (BP Location: Left Arm, Patient Position: Sitting, Cuff Size: Normal)   Pulse 76   Ht 5\' 7"  (1.702 m)   Wt 167 lb (75.8 kg)   SpO2 98%   BMI 26.16 kg/m  , BMI Body mass index is 26.16 kg/m. GEN: Well nourished, well developed, in no acute distress  HEENT: normal  Neck: no JVD, carotid bruits, or masses Cardiac: RRR; no murmurs, rubs, or gallops,no edema  Respiratory:  clear to auscultation bilaterally, normal work of breathing GI: soft, nontender, nondistended, + BS MS: no deformity or atrophy  Skin: warm and dry, no rash Neuro:  Strength and sensation are intact  Psych: euthymic mood, full affect   EKG:  EKG is not ordered today.    Recent Labs: 03/29/2020: Creatinine, Ser 1.10    Lipid Panel No results found for: CHOL, TRIG, HDL, CHOLHDL, VLDL, LDLCALC, LDLDIRECT    Wt Readings from Last 3 Encounters:  12/20/20 167 lb (75.8 kg)  04/22/20 165 lb (74.8 kg)  04/06/20 168 lb 2 oz (76.3 kg)       PAD Screen 02/28/2020  Previous PAD dx? No  Previous surgical procedure? No  Pain with walking? No  Feet/toe relief with dangling? No  Painful, non-healing ulcers? No  Extremities discolored? No      ASSESSMENT AND PLAN:  1.  Chest pain: Likely musculoskeletal based on description. However, his symptoms persisted. Given that he had abnormal  CT scan of the lungs in June, I am going to get a follow-up CT chest without contrast. In addition, I requested an echocardiogram to ensure no structural heart abnormalities.  2.  Essential hypertension: Blood pressure improved with addition of losartan. I suggested increasing the dose to 100 mg daily but he prefers to stay on the same dosages for now.  3.  Tobacco use: He does not smoke but continues to dip.   4.  Mild hyperlipidemia: Most recent lipid profile showed an LDL of 136. Continue with healthy lifestyle changes.   Disposition:   FU with me in 6 months  Signed,  Lorine Bears, MD  12/20/2020 11:04 AM    [facility]

## 2020-12-25 ENCOUNTER — Other Ambulatory Visit: Payer: Self-pay | Admitting: Cardiovascular Disease

## 2021-01-01 ENCOUNTER — Ambulatory Visit
Admission: RE | Admit: 2021-01-01 | Discharge: 2021-01-01 | Disposition: A | Discharge status: Home / Self Care | Payer: 59 | Source: Ambulatory Visit | Attending: Cardiovascular Disease | Admitting: Cardiovascular Disease

## 2021-01-01 ENCOUNTER — Other Ambulatory Visit: Payer: Self-pay

## 2021-01-01 DIAGNOSIS — R079 Chest pain, unspecified: Secondary | ICD-10-CM | POA: Diagnosis present

## 2021-01-01 DIAGNOSIS — R9389 Abnormal findings on diagnostic imaging of other specified body structures: Secondary | ICD-10-CM | POA: Diagnosis present

## 2021-01-08 ENCOUNTER — Other Ambulatory Visit: Payer: 59

## 2021-01-24 ENCOUNTER — Ambulatory Visit (INDEPENDENT_AMBULATORY_CARE_PROVIDER_SITE_OTHER): Payer: 59

## 2021-01-24 ENCOUNTER — Other Ambulatory Visit: Payer: Self-pay

## 2021-01-24 DIAGNOSIS — R079 Chest pain, unspecified: Secondary | ICD-10-CM | POA: Diagnosis not present

## 2021-01-24 LAB — ECHOCARDIOGRAM COMPLETE
AR max vel: 1.93 cm2
AV Area VTI: 1.97 cm2
AV Area mean vel: 1.94 cm2
AV Mean grad: 4 mmHg
AV Peak grad: 7.3 mmHg
Ao pk vel: 1.35 m/s
Area-P 1/2: 6.43 cm2
S' Lateral: 3.1 cm

## 2021-02-05 ENCOUNTER — Other Ambulatory Visit: Payer: Self-pay | Admitting: Family Medicine

## 2021-02-05 ENCOUNTER — Other Ambulatory Visit (HOSPITAL_COMMUNITY): Payer: Self-pay | Admitting: Family Medicine

## 2021-02-05 DIAGNOSIS — E041 Nontoxic single thyroid nodule: Secondary | ICD-10-CM

## 2021-02-15 ENCOUNTER — Ambulatory Visit
Admission: RE | Admit: 2021-02-15 | Discharge: 2021-02-15 | Disposition: A | Discharge status: Home / Self Care | Payer: 59 | Source: Ambulatory Visit | Attending: Family Medicine | Admitting: Family Medicine

## 2021-02-15 ENCOUNTER — Other Ambulatory Visit: Payer: Self-pay

## 2021-02-15 DIAGNOSIS — E041 Nontoxic single thyroid nodule: Secondary | ICD-10-CM | POA: Insufficient documentation

## 2021-06-13 IMAGING — CT CT HEART MORP W/ CTA COR W/ SCORE W/ CA W/CM &/OR W/O CM
1 of 14 series · 3 of 20 positions shown, 4 images · non-contrast
Comparison: None.

Addendum:
CLINICAL DATA: chestpain

EXAM:
Cardiac/Coronary  CTA
TECHNIQUE: The patient was scanned on a Siemens Somatoform go.Top scanner.

[Series 44: ms multiphase cta coronary 0.60 · axial · 0.36mm/px · z∈[-1081,-1018]mm · 3 of 2817 slices shown, 4 images]
[im 705/2817  vessel]
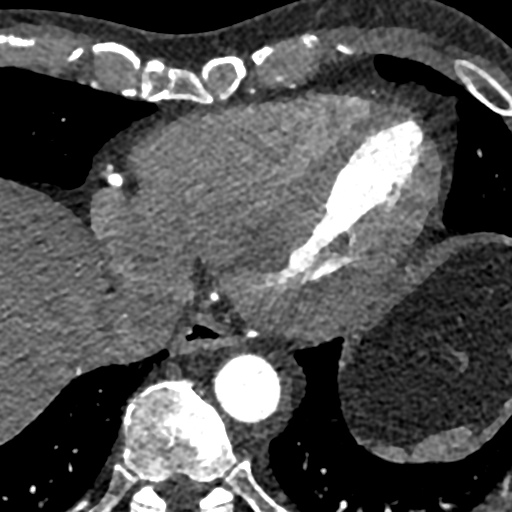
[im 705/2817  lung]
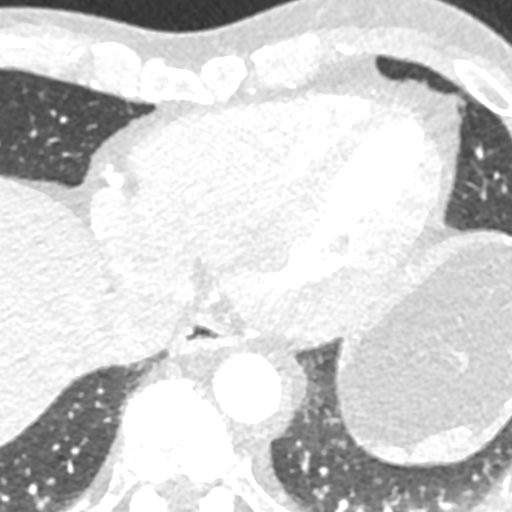
[im 1409/2817  vessel]
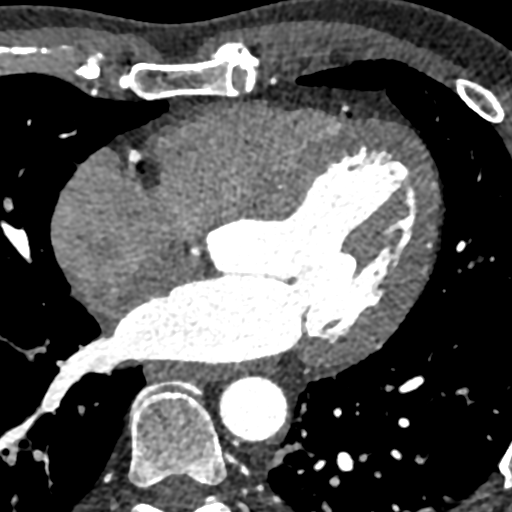
[im 2113/2817  vessel]
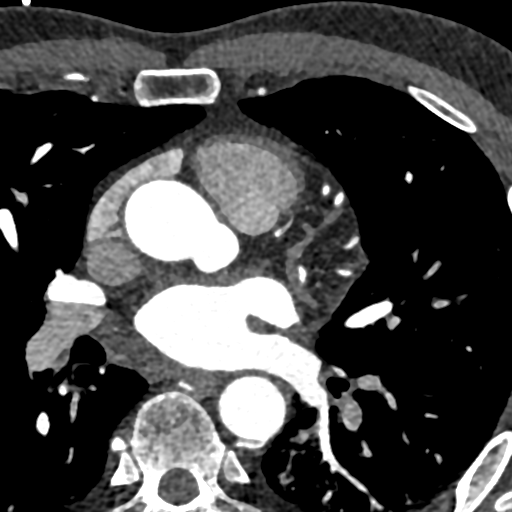

[3 of 20 positions shown; findings below may reference images not displayed]

FINDINGS: A retrospective scan was triggered in the descending thoracic aorta.
Axial non-contrast 3 mm slices were carried out through the heart.
The data set was analyzed on a dedicated work station and scored
using the Agatson method. Gantry rotation speed was 330 msecs and
collimation was .6 mm. 100mg of metoprolol and 0.8 mg of sl NTG was
given. The 3D data set was reconstructed in 5% intervals of the
50-95 % of the R-R cycle. Diastolic phases were analyzed on a
dedicated work station using MPR, MIP and VRT modes. The patient
received 75 cc of contrast.

Aorta: Normal size. Minimal aortic root wall calcifications. No
dissection.

Aortic Valve:  Trileaflet.  No calcifications.

Coronary Arteries:  Normal coronary origin.  Right dominance.

RCA is a large dominant artery that gives rise to PDA and PLA. There
is no plaque.

Left main is a large artery that gives rise to LAD and LCX arteries.

LAD is a large vessel that has no plaque.

LCX is a non-dominant artery that gives rise to 3 obtuse marginal
branches. There is no plaque in the LCx or OM branches.

Other findings:

Normal pulmonary vein drainage into the left atrium.

Normal left atrial appendage without a thrombus.

Normal size of the pulmonary artery.
IMPRESSION: 1. Very low coronary calcium score of 0.77. This was 46th percentile
for age and sex matched control.

2. Normal coronary origin with right dominance.

3. No evidence of obstructive CAD.

4. CAD-RADS 0. Consider non-atherosclerotic causes of chest pain.

EXAM:
OVER-READ INTERPRETATION  CT CHEST

The following report is an over-read performed by radiologist Dr.
does not include interpretation of cardiac or coronary anatomy or
pathology. The coronary calcium score/coronary CTA interpretation by
the cardiologist is attached.
FINDINGS: The visualized portions of the lower lung fields reticulonodular
opacities and patchy airspace opacity in the medial left lower lobe,
suspicious for infectious or inflammatory etiology. No pleural
fluid seen.

The visualized portions of the mediastinum and chest wall are
unremarkable.
IMPRESSION: Reticulonodular opacities and patchy airspace opacity in the medial
left lower lobe, suspicious for infectious or inflammatory etiology.
Recommend clinical correlation, and consider chest radiograph or
chest CT for further evaluation.

These results will be called to the ordering clinician or
representative by the Radiologist Assistant, and communication
documented in the PACS or [REDACTED].

*** End of Addendum ***
FINDINGS: A retrospective scan was triggered in the descending thoracic aorta.
Axial non-contrast 3 mm slices were carried out through the heart.
The data set was analyzed on a dedicated work station and scored
using the Agatson method. Gantry rotation speed was 330 msecs and
collimation was .6 mm. 100mg of metoprolol and 0.8 mg of sl NTG was
given. The 3D data set was reconstructed in 5% intervals of the
50-95 % of the R-R cycle. Diastolic phases were analyzed on a
dedicated work station using MPR, MIP and VRT modes. The patient
received 75 cc of contrast.

Aorta: Normal size. Minimal aortic root wall calcifications. No
dissection.

Aortic Valve:  Trileaflet.  No calcifications.

Coronary Arteries:  Normal coronary origin.  Right dominance.

RCA is a large dominant artery that gives rise to PDA and PLA. There
is no plaque.

Left main is a large artery that gives rise to LAD and LCX arteries.

LAD is a large vessel that has no plaque.

LCX is a non-dominant artery that gives rise to 3 obtuse marginal
branches. There is no plaque in the LCx or OM branches.

Other findings:

Normal pulmonary vein drainage into the left atrium.

Normal left atrial appendage without a thrombus.

Normal size of the pulmonary artery.
IMPRESSION: 1. Very low coronary calcium score of 0.77. This was 46th percentile
for age and sex matched control.

2. Normal coronary origin with right dominance.

3. No evidence of obstructive CAD.

4. CAD-RADS 0. Consider non-atherosclerotic causes of chest pain.

## 2022-01-06 DIAGNOSIS — H401123 Primary open-angle glaucoma, left eye, severe stage: Secondary | ICD-10-CM | POA: Diagnosis not present

## 2022-01-20 DIAGNOSIS — L578 Other skin changes due to chronic exposure to nonionizing radiation: Secondary | ICD-10-CM | POA: Diagnosis not present

## 2022-01-20 DIAGNOSIS — D1801 Hemangioma of skin and subcutaneous tissue: Secondary | ICD-10-CM | POA: Diagnosis not present

## 2022-01-20 DIAGNOSIS — L570 Actinic keratosis: Secondary | ICD-10-CM | POA: Diagnosis not present

## 2022-01-20 DIAGNOSIS — Z86018 Personal history of other benign neoplasm: Secondary | ICD-10-CM | POA: Diagnosis not present

## 2022-01-20 DIAGNOSIS — L738 Other specified follicular disorders: Secondary | ICD-10-CM | POA: Diagnosis not present

## 2022-02-18 DIAGNOSIS — H401123 Primary open-angle glaucoma, left eye, severe stage: Secondary | ICD-10-CM | POA: Diagnosis not present

## 2022-02-18 DIAGNOSIS — H401112 Primary open-angle glaucoma, right eye, moderate stage: Secondary | ICD-10-CM | POA: Diagnosis not present

## 2022-04-10 DIAGNOSIS — I10 Essential (primary) hypertension: Secondary | ICD-10-CM | POA: Diagnosis not present

## 2022-04-10 DIAGNOSIS — G609 Hereditary and idiopathic neuropathy, unspecified: Secondary | ICD-10-CM | POA: Diagnosis not present

## 2022-04-11 DIAGNOSIS — Z0000 Encounter for general adult medical examination without abnormal findings: Secondary | ICD-10-CM | POA: Diagnosis not present

## 2022-04-11 DIAGNOSIS — Z125 Encounter for screening for malignant neoplasm of prostate: Secondary | ICD-10-CM | POA: Diagnosis not present

## 2022-04-11 DIAGNOSIS — G609 Hereditary and idiopathic neuropathy, unspecified: Secondary | ICD-10-CM | POA: Diagnosis not present

## 2022-04-18 DIAGNOSIS — E538 Deficiency of other specified B group vitamins: Secondary | ICD-10-CM | POA: Diagnosis not present

## 2022-05-09 DIAGNOSIS — E538 Deficiency of other specified B group vitamins: Secondary | ICD-10-CM | POA: Diagnosis not present

## 2022-05-16 DIAGNOSIS — E538 Deficiency of other specified B group vitamins: Secondary | ICD-10-CM | POA: Diagnosis not present

## 2022-06-05 DIAGNOSIS — H34811 Central retinal vein occlusion, right eye, with macular edema: Secondary | ICD-10-CM | POA: Diagnosis not present

## 2022-07-31 DIAGNOSIS — R7303 Prediabetes: Secondary | ICD-10-CM | POA: Diagnosis not present

## 2022-07-31 DIAGNOSIS — E538 Deficiency of other specified B group vitamins: Secondary | ICD-10-CM | POA: Diagnosis not present

## 2022-07-31 DIAGNOSIS — I10 Essential (primary) hypertension: Secondary | ICD-10-CM | POA: Diagnosis not present

## 2022-07-31 DIAGNOSIS — K429 Umbilical hernia without obstruction or gangrene: Secondary | ICD-10-CM | POA: Diagnosis not present

## 2022-07-31 DIAGNOSIS — M545 Low back pain, unspecified: Secondary | ICD-10-CM | POA: Diagnosis not present

## 2022-07-31 DIAGNOSIS — Z0000 Encounter for general adult medical examination without abnormal findings: Secondary | ICD-10-CM | POA: Diagnosis not present

## 2022-08-12 ENCOUNTER — Ambulatory Visit: Payer: Self-pay | Admitting: Surgery

## 2022-08-12 DIAGNOSIS — K429 Umbilical hernia without obstruction or gangrene: Secondary | ICD-10-CM | POA: Diagnosis not present

## 2022-08-12 NOTE — H&P (Signed)
Subjective:    CC: Umbilical hernia without obstruction and without gangrene [K42.9]   HPI:  Peter Stanton. is a 56 y.o. male who was referred by Lovie Macadamia, MD for evaluation of above. Aysmptomatic but increasing in size   Past Medical History:  has a past medical history of Glaucoma (increased eye pressure) and Hypertension.   Past Surgical History:       Past Surgical History:  Procedure Laterality Date   COLONOSCOPY   12/22/2003    Dr. Ivor Messier - Nml   COLONOSCOPY   10/08/2016    Entire examined colon is normal/Repeat 50yr/MUS      Family History: family history includes Hyperlipidemia (Elevated cholesterol) in his mother.   Social History:  reports that he has never smoked. He uses smokeless tobacco. He reports current alcohol use. He reports that he does not use drugs.   Current Medications: has a current medication list which includes the following prescription(s): amlodipine, aspirin, brimonidine, latanoprost, and losartan.   Allergies:       Allergies as of 08/12/2022 - Reviewed 07/31/2022  Allergen Reaction Noted   Bee sting kit Hives 08/12/2022   Meloxicam Hives and Itching 08/22/2019      ROS:  A 15 point review of systems was performed and pertinent positives and negatives noted in HPI   Objective:    BP (!) 148/92   Pulse 80   Ht 168.9 cm (5' 6.5")   Wt 74.8 kg (165 lb)   BMI 26.23 kg/m    Constitutional :  Alert, cooperative, no distress  Lymphatics/Throat:  Supple, no lymphadenopathy  Respiratory:  clear to auscultation bilaterally  Cardiovascular:  regular rate and rhythm  Gastrointestinal: soft, non-tender; bowel sounds normal; no masses,  no organomegaly. umbilical hernia noted.  small, reducible, and no overlying skin changes  Musculoskeletal: Steady gait and movement  Skin: Cool and moist, no surgical scars  Psychiatric: Normal affect, non-agitated, not confused         LABS:  N/a    RADS: N/a Assessment:        Umbilical  hernia without obstruction and without gangrene [K42.9]   Plan:    1. Umbilical hernia without obstruction and without gangrene [K42.9]   Discussed the risk of surgery including recurrence, which can be up to 50% in the case of incisional or complex hernias, possible use of prosthetic materials (mesh) and the increased risk of mesh infxn if used, bleeding, chronic pain, post-op infxn, post-op SBO or ileus, and possible re-operation to address said risks. The risks of general anesthetic, if used, includes MI, CVA, sudden death or even reaction to anesthetic medications also discussed. Alternatives include continued observation.  Benefits include possible symptom relief, prevention of incarceration, strangulation, enlargement in size over time, and the risk of emergency surgery in the face of strangulation.    Typical post-op recovery time of 3-5 days with 2 weeks of activity restrictions were also discussed.   ED return precautions given for sudden increase in pain, size of hernia with accompanying fever, nausea, and/or vomiting.   The patient verbalized understanding and all questions were answered to the patient's satisfaction.     2. Patient has elected to proceed with surgical treatment. Procedure will be scheduled. Open due to small size   labs/images/medications/previous chart entries reviewed personally and relevant changes/updates noted above.

## 2022-08-14 DIAGNOSIS — E042 Nontoxic multinodular goiter: Secondary | ICD-10-CM | POA: Diagnosis not present

## 2022-08-15 ENCOUNTER — Inpatient Hospital Stay
Admission: RE | Admit: 2022-08-15 | Discharge: 2022-08-15 | Disposition: A | Discharge status: Home / Self Care | Payer: Self-pay | Source: Ambulatory Visit

## 2022-08-15 NOTE — Pre-Procedure Instructions (Signed)
Patient voiced to me that he does not wish to proceed with this procedure at this time. Dr. Lysle Pearl made aware via secure chat.

## 2022-08-15 NOTE — Patient Instructions (Signed)
Your procedure is scheduled on: 08/22/22 - Friday Report to the Registration Desk on the 1st floor of the [facility]. To find out your arrival time, please call [phone] between 1PM - 3PM on: 08/21/22 - Thursday If your arrival time is 6:00 am, do not arrive prior to that time as the [facility] entrance doors do not open until 6:00 am.  REMEMBER: Instructions that are not followed completely may result in serious medical risk, up to and including death; or upon the discretion of your surgeon and anesthesiologist your surgery may need to be rescheduled.  Do not eat food after midnight the night before surgery.  No gum chewing, lozengers or hard candies.  You may however, drink CLEAR liquids up to 2 hours before you are scheduled to arrive for your surgery. Do not drink anything within 2 hours of your scheduled arrival time.  Clear liquids include: - water  - apple juice without pulp - gatorade (not RED colors) - black coffee or tea (Do NOT add milk or creamers to the coffee or tea) Do NOT drink anything that is not on this list.  TAKE THESE MEDICATIONS THE MORNING OF SURGERY WITH A SIP OF WATER:  - amLODipine (NORVASC) - brimonidine (ALPHAGAN)   One week prior to surgery: Stop Anti-inflammatories (NSAIDS) such as Advil, Aleve, Ibuprofen, Motrin, Naproxen, Naprosyn and Aspirin based products such as Excedrin, Goodys Powder, BC Powder. Stop ANY OVER THE COUNTER supplements until after surgery. You may however, continue to take Tylenol if needed for pain up until the day of surgery.  No Alcohol for 24 hours before or after surgery.  No Smoking including e-cigarettes for 24 hours prior to surgery.  No chewable tobacco products for at least 6 hours prior to surgery.  No nicotine patches on the day of surgery.  Do not use any "recreational" drugs for at least a week prior to your surgery.  Please be advised that the combination of cocaine and anesthesia may have negative  outcomes, up to and including death. If you test positive for cocaine, your surgery will be cancelled.  On the morning of surgery brush your teeth with toothpaste and water, you may rinse your mouth with mouthwash if you wish. Do not swallow any toothpaste or mouthwash.  Use CHG Soap or wipes as directed on instruction sheet.  Do not wear jewelry, make-up, hairpins, clips or nail polish.  Do not wear lotions, powders, or perfumes.   Do not shave body from the neck down 48 hours prior to surgery just in case you cut yourself which could leave a site for infection.  Also, freshly shaved skin may become irritated if using the CHG soap.  Contact lenses, hearing aids and dentures may not be worn into surgery.  Do not bring valuables to the hospital. [facility] is not responsible for any missing/lost belongings or valuables.   Notify your doctor if there is any change in your medical condition (cold, fever, infection).  Wear comfortable clothing (specific to your surgery type) to the hospital.  After surgery, you can help prevent lung complications by doing breathing exercises.  Take deep breaths and cough every 1-2 hours. Your doctor may order a device called an Incentive Spirometer to help you take deep breaths. When coughing or sneezing, hold a pillow firmly against your incision with both hands. This is called "splinting." Doing this helps protect your incision. It also decreases belly discomfort.  If you are being admitted to the hospital overnight, leave  your suitcase in the car. After surgery it may be brought to your room.  If you are being discharged the day of surgery, you will not be allowed to drive home. You will need a responsible adult (18 years or older) to drive you home and stay with you that night.   If you are taking public transportation, you will need to have a responsible adult (18 years or older) with you. Please confirm with your physician that it is acceptable  to use public transportation.   Please call the [facility] at [phone] if you have any questions about these instructions.  Surgery Visitation Policy:  Patients undergoing a surgery or procedure may have two family members or support persons with them as long as the person is not COVID-19 positive or experiencing its symptoms.   Inpatient Visitation:    Visiting hours are 7 a.m. to 8 p.m. Up to four visitors are allowed at one time in a patient room, including children. The visitors may rotate out with other people during the day. One designated support person (adult) may remain overnight.

## 2022-08-21 DIAGNOSIS — E042 Nontoxic multinodular goiter: Secondary | ICD-10-CM | POA: Diagnosis not present

## 2022-08-22 ENCOUNTER — Encounter: Admission: RE | Payer: Self-pay | Source: Home / Self Care

## 2022-08-22 ENCOUNTER — Ambulatory Visit: Admission: RE | Admit: 2022-08-22 | Payer: 59 | Source: Home / Self Care | Admitting: Surgery

## 2022-08-22 SURGERY — REPAIR, HERNIA, UMBILICAL, ADULT
Anesthesia: General

## 2022-08-25 DIAGNOSIS — E042 Nontoxic multinodular goiter: Secondary | ICD-10-CM | POA: Diagnosis not present

## 2022-12-11 DIAGNOSIS — H401133 Primary open-angle glaucoma, bilateral, severe stage: Secondary | ICD-10-CM | POA: Diagnosis not present

## 2023-01-22 DIAGNOSIS — D492 Neoplasm of unspecified behavior of bone, soft tissue, and skin: Secondary | ICD-10-CM | POA: Diagnosis not present

## 2023-01-22 DIAGNOSIS — L738 Other specified follicular disorders: Secondary | ICD-10-CM | POA: Diagnosis not present

## 2023-01-22 DIAGNOSIS — L578 Other skin changes due to chronic exposure to nonionizing radiation: Secondary | ICD-10-CM | POA: Diagnosis not present

## 2023-01-22 DIAGNOSIS — Z872 Personal history of diseases of the skin and subcutaneous tissue: Secondary | ICD-10-CM | POA: Diagnosis not present

## 2023-01-22 DIAGNOSIS — Z86018 Personal history of other benign neoplasm: Secondary | ICD-10-CM | POA: Diagnosis not present

## 2023-06-12 DIAGNOSIS — H34811 Central retinal vein occlusion, right eye, with macular edema: Secondary | ICD-10-CM | POA: Diagnosis not present

## 2023-06-12 DIAGNOSIS — H401133 Primary open-angle glaucoma, bilateral, severe stage: Secondary | ICD-10-CM | POA: Diagnosis not present

## 2023-07-14 DIAGNOSIS — H401133 Primary open-angle glaucoma, bilateral, severe stage: Secondary | ICD-10-CM | POA: Diagnosis not present

## 2023-07-14 DIAGNOSIS — H34811 Central retinal vein occlusion, right eye, with macular edema: Secondary | ICD-10-CM | POA: Diagnosis not present

## 2023-07-27 DIAGNOSIS — Z13220 Encounter for screening for lipoid disorders: Secondary | ICD-10-CM | POA: Diagnosis not present

## 2023-07-27 DIAGNOSIS — Z0000 Encounter for general adult medical examination without abnormal findings: Secondary | ICD-10-CM | POA: Diagnosis not present

## 2023-08-03 DIAGNOSIS — D649 Anemia, unspecified: Secondary | ICD-10-CM | POA: Diagnosis not present

## 2023-08-03 DIAGNOSIS — J069 Acute upper respiratory infection, unspecified: Secondary | ICD-10-CM | POA: Diagnosis not present

## 2023-08-03 DIAGNOSIS — R5381 Other malaise: Secondary | ICD-10-CM | POA: Diagnosis not present

## 2023-08-03 DIAGNOSIS — Z0000 Encounter for general adult medical examination without abnormal findings: Secondary | ICD-10-CM | POA: Diagnosis not present

## 2023-08-03 DIAGNOSIS — K429 Umbilical hernia without obstruction or gangrene: Secondary | ICD-10-CM | POA: Diagnosis not present

## 2023-08-03 DIAGNOSIS — E538 Deficiency of other specified B group vitamins: Secondary | ICD-10-CM | POA: Diagnosis not present

## 2023-08-03 DIAGNOSIS — R5383 Other fatigue: Secondary | ICD-10-CM | POA: Diagnosis not present

## 2023-08-03 DIAGNOSIS — E042 Nontoxic multinodular goiter: Secondary | ICD-10-CM | POA: Diagnosis not present

## 2023-08-03 DIAGNOSIS — I10 Essential (primary) hypertension: Secondary | ICD-10-CM | POA: Diagnosis not present

## 2023-08-03 DIAGNOSIS — F17200 Nicotine dependence, unspecified, uncomplicated: Secondary | ICD-10-CM | POA: Diagnosis not present

## 2023-08-19 DIAGNOSIS — E042 Nontoxic multinodular goiter: Secondary | ICD-10-CM | POA: Diagnosis not present

## 2023-08-26 DIAGNOSIS — E042 Nontoxic multinodular goiter: Secondary | ICD-10-CM | POA: Diagnosis not present

## 2023-10-12 ENCOUNTER — Ambulatory Visit: Payer: Self-pay | Admitting: Surgery

## 2023-10-12 DIAGNOSIS — K429 Umbilical hernia without obstruction or gangrene: Secondary | ICD-10-CM | POA: Diagnosis not present

## 2023-10-12 NOTE — H&P (Signed)
Subjective:    CC: Umbilical hernia without obstruction and without gangrene [K42.9]   HPI:   Subjective Peter Stanton. is a 57 y.o. male who was referred by Sandie Ano, MD for evaluation of above. Aysmptomatic but increasing in size.  Didn't pursue repair last year due to cost issues.   Past Medical History:  has a past medical history of Glaucoma (increased eye pressure) and Hypertension.   Past Surgical History:  Past Surgical History       Past Surgical History:  Procedure Laterality Date   COLONOSCOPY   12/22/2003    Dr. Oval Linsey - Nml   COLONOSCOPY   10/08/2016    Entire examined colon is normal/Repeat 65yrs/MUS        Family History: family history includes Alzheimer's disease in his mother; Hyperlipidemia (Elevated cholesterol) in his mother; Pancreatic cancer in his paternal grandfather.   Social History:  reports that he has never smoked. He uses smokeless tobacco. He reports current alcohol use. He reports that he does not use drugs.   Current Medications: has a current medication list which includes the following prescription(s): amlodipine, aspirin, brimonidine, bupropion hcl (smoking deter), latanoprost, and prednisone.   Allergies:       Allergies as of 10/12/2023 - Reviewed 08/26/2023  Allergen Reaction Noted   Bee sting kit Hives 08/12/2022   Meloxicam Hives and Itching 08/22/2019      ROS:  A 15 point review of systems was performed and pertinent positives and negatives noted in HPI   Objective:      Objective There were no vitals taken for this visit.   Constitutional :  Alert, cooperative, no distress  Lymphatics/Throat:  Supple, no lymphadenopathy  Respiratory:  clear to auscultation bilaterally  Cardiovascular:  regular rate and rhythm  Gastrointestinal: soft, non-tender; bowel sounds normal; no masses,  no organomegaly. umbilical hernia noted.  small, reducible, and no overlying skin changes  Musculoskeletal: Steady gait and movement   Skin: Cool and moist, no surgical scars  Psychiatric: Normal affect, non-agitated, not confused         LABS:  N/a    RADS: N/a Assessment:      Assessment Umbilical hernia without obstruction and without gangrene [K42.9]   Plan:      Plan 1. Umbilical hernia without obstruction and without gangrene [K42.9]   Discussed the risk of surgery including recurrence, which can be up to 50% in the case of incisional or complex hernias, possible use of prosthetic materials (mesh) and the increased risk of mesh infxn if used, bleeding, chronic pain, post-op infxn, post-op SBO or ileus, and possible re-operation to address said risks. The risks of general anesthetic, if used, includes MI, CVA, sudden death or even reaction to anesthetic medications also discussed. Alternatives include continued observation.  Benefits include possible symptom relief, prevention of incarceration, strangulation, enlargement in size over time, and the risk of emergency surgery in the face of strangulation.    Typical post-op recovery time of 3-5 days with 2 weeks of activity restrictions were also discussed.   ED return precautions given for sudden increase in pain, size of hernia with accompanying fever, nausea, and/or vomiting.   The patient verbalized understanding and all questions were answered to the patient's satisfaction.     2. Patient has elected to proceed with surgical treatment. Procedure will be scheduled. Robotic assisted Laparoscopic per patient preference   labs/images/medications/previous chart entries reviewed personally and relevant changes/updates noted above.

## 2023-10-28 DIAGNOSIS — K429 Umbilical hernia without obstruction or gangrene: Secondary | ICD-10-CM

## 2023-10-28 HISTORY — DX: Umbilical hernia without obstruction or gangrene: K42.9

## 2023-11-01 ENCOUNTER — Encounter: Payer: Self-pay | Admitting: Urgent Care

## 2023-11-05 ENCOUNTER — Inpatient Hospital Stay: Admission: RE | Admit: 2023-11-05 | Payer: 59 | Source: Ambulatory Visit

## 2023-11-06 ENCOUNTER — Inpatient Hospital Stay
Admission: RE | Admit: 2023-11-06 | Discharge: 2023-11-06 | Disposition: A | Discharge status: Home / Self Care | Payer: 59 | Source: Ambulatory Visit

## 2023-11-06 HISTORY — DX: Unspecified glaucoma: H40.9

## 2023-11-06 HISTORY — DX: Anemia, unspecified: D64.9

## 2023-11-06 HISTORY — DX: Prediabetes: R73.03

## 2023-11-06 HISTORY — DX: Essential (primary) hypertension: I10

## 2023-11-06 HISTORY — DX: Deficiency of other specified B group vitamins: E53.8

## 2023-11-06 HISTORY — DX: Other chest pain: R07.89

## 2023-11-06 HISTORY — DX: Nontoxic multinodular goiter: E04.2

## 2023-11-06 NOTE — Pre-Procedure Instructions (Signed)
 Call to patient for pre-admission interview. Patient stated that he was told that he would need to pay $9,000 for his surgery and was going to call his insurance company. He also stated that he has an appt. with Dr. Darron (cardiology) on 11/12/23 at 4:20 pm for an EKG. Surgery (umbilical hernia repair) is scheduled for 11/13/23. Patient stated that he was going to call the surgeon's office (Dr. Tye) and reschedule the surgery for the following week if possible to give him time to get answers from the insurance company and to have time to see Dr Darron to get EKG. Patient given Dr. Estelita office number. Office was also notified by me regarding the above.

## 2023-11-12 ENCOUNTER — Ambulatory Visit: Payer: 59 | Admitting: Cardiovascular Disease

## 2023-11-13 ENCOUNTER — Inpatient Hospital Stay: Admission: RE | Admit: 2023-11-13 | Payer: 59 | Source: Ambulatory Visit

## 2023-11-17 ENCOUNTER — Ambulatory Visit: Payer: 59 | Attending: Cardiovascular Disease | Admitting: Cardiovascular Disease

## 2023-11-17 ENCOUNTER — Encounter: Payer: Self-pay | Admitting: Cardiovascular Disease

## 2023-11-17 VITALS — BP 162/102 | HR 82 | Ht 67.0 in | Wt 172.2 lb

## 2023-11-17 DIAGNOSIS — I10 Essential (primary) hypertension: Secondary | ICD-10-CM

## 2023-11-17 DIAGNOSIS — E785 Hyperlipidemia, unspecified: Secondary | ICD-10-CM | POA: Diagnosis not present

## 2023-11-17 DIAGNOSIS — Z720 Tobacco use: Secondary | ICD-10-CM

## 2023-11-17 MED ORDER — LISINOPRIL 10 MG PO TABS: 10.0000 mg | ORAL_TABLET | Freq: Every day | ORAL | 3 refills | Status: DC

## 2023-11-17 NOTE — Progress Notes (Signed)
Cardiology Office Note   Date:  11/17/2023   ID:  Peter Stanton, DOB 1966/01/16, MRN [id number]  PCP:  Jerl Mina, MD  Cardiologist:   Lorine Bears, MD   Chief Complaint  Patient presents with   Follow-up    Patient denies new or acute cardiac problems/concerns today.  BP elevated on office check today.        History of Present Illness: Peter Stanton is a 58 y.o. male who is here today for follow-up with regarding chest pain and slightly elevated calcium score.  He has known history of essential hypertension.    He was seen in the past for atypical chest pain thought to be musculoskeletal.  Cardiac CTA was done in June 2021 which showed minimally elevated calcium score of 0.77 with near normal coronary arteries with no evidence of obstructive disease.  The CT did show incidental finding of reticulonodular opacities and patchy airspace disease in the medial left lower lobe suspicious for infectious or inflammatory etiology.  CT chest was repeated a year later and it was clear.  He has not been seen by me since 2022.  He has no exertional chest pain although he continues to have random left-sided chest pain that sounds musculoskeletal.  No exertional dyspnea.  He used to be on losartan but stopped due to fatigue.  His blood pressure is not controlled in spite of amlodipine.   Past Medical History:  Diagnosis Date   Anemia    Atypical chest pain    B12 deficiency    Essential hypertension    Glaucoma    Multinodular goiter    Pre-diabetes    Retinal vein occlusion     Past Surgical History:  Procedure Laterality Date   COLONOSCOPY  12/22/2003   COLONOSCOPY  10/08/2016     Current Outpatient Medications  Medication Sig Dispense Refill   amLODipine (NORVASC) 5 MG tablet Take 5 mg by mouth daily.     aspirin 81 MG EC tablet Take 81 mg by mouth daily.     brimonidine-timolol (COMBIGAN) 0.2-0.5 % ophthalmic solution Place 1 drop into both eyes every 12 (twelve) hours.      EPINEPHrine 0.3 mg/0.3 mL IJ SOAJ injection Inject 0.3 mg into the muscle as needed for anaphylaxis.     latanoprost (XALATAN) 0.005 % ophthalmic solution Place 1 drop into both eyes at bedtime.     No current facility-administered medications for this visit.    Allergies:   Bee venom and Meloxicam    Social History:  The patient  reports that he has never smoked. His smokeless tobacco use includes chew. He reports current alcohol use of about 12.0 standard drinks of alcohol per week. He reports that he does not use drugs.   Family History:  The patient does not no family history on his father's side.  No family history of heart disease on his mother side.   ROS:  Please see the history of present illness.   Otherwise, review of systems are positive for none.   All other systems are reviewed and negative.    PHYSICAL EXAM: VS:  BP (!) 162/102 (BP Location: Left Arm, Patient Position: Sitting, Cuff Size: Normal)   Pulse 82   Ht 5\' 7"  (1.702 m)   Wt 172 lb 3.2 oz (78.1 kg)   SpO2 99%   BMI 26.97 kg/m  , BMI Body mass index is 26.97 kg/m. GEN: Well nourished, well developed, in no acute distress  HEENT:  normal  Neck: no JVD, carotid bruits, or masses Cardiac: RRR; no murmurs, rubs, or gallops,no edema  Respiratory:  clear to auscultation bilaterally, normal work of breathing GI: soft, nontender, nondistended, + BS MS: no deformity or atrophy  Skin: warm and dry, no rash Neuro:  Strength and sensation are intact Psych: euthymic mood, full affect   EKG:  EKG is  ordered today.  EKG showed:Normal sinus rhythm Normal ECG When compared with ECG of 03-May-2008 21:23, No significant change was found   Recent Labs: No results found for requested labs within last 365 days.    Lipid Panel No results found for: "CHOL", "TRIG", "HDL", "CHOLHDL", "VLDL", "LDLCALC", "LDLDIRECT"    Wt Readings from Last 3 Encounters:  11/17/23 172 lb 3.2 oz (78.1 kg)  12/20/20 167 lb (75.8  kg)  04/22/20 165 lb (74.8 kg)          02/28/2020    3:06 PM  PAD Screen  Previous PAD dx? No  Previous surgical procedure? No  Pain with walking? No  Feet/toe relief with dangling? No  Painful, non-healing ulcers? No  Extremities discolored? No      ASSESSMENT AND PLAN:  1.  Chest pain: Likely musculoskeletal based on description.  No exertional symptoms.  2.  Essential hypertension: Blood pressure is elevated lately.  He reports fatigue with losartan and prefers to go back on lisinopril which she tolerated for many years.  I put him on lisinopril 10 mg daily.  3.  Tobacco use: He does not smoke but continues to dip.  I discussed the importance of cessation.  4.  Mild hyperlipidemia: Most recent lipid profile in September showed an LDL of 129.  Will plan on repeat CT calcium score next year.   Disposition:   FU with me in 12 months  Signed,  Lorine Bears, MD  11/17/2023 4:22 PM    [facility]

## 2023-11-17 NOTE — Patient Instructions (Signed)
Medication Instructions:  START Lisinopril 10 mg once daily  *If you need a refill on your cardiac medications before your next appointment, please call your pharmacy*   Lab Work: None ordered If you have labs (blood work) drawn today and your tests are completely normal, you will receive your results only by: MyChart Message (if you have MyChart) OR A paper copy in the mail If you have any lab test that is abnormal or we need to change your treatment, we will call you to review the results.   Testing/Procedures: None ordered   Follow-Up: At [facility], you and your health needs are our priority.  As part of our continuing mission to provide you with exceptional heart care, we have created designated Provider Care Teams.  These Care Teams include your primary Cardiologist (physician) and Advanced Practice Providers (APPs -  Physician Assistants and Nurse Practitioners) who all work together to provide you with the care you need, when you need it.  We recommend signing up for the patient portal called "MyChart".  Sign up information is provided on this After Visit Summary.  MyChart is used to connect with patients for Virtual Visits (Telemedicine).  Patients are able to view lab/test results, encounter notes, upcoming appointments, etc.  Non-urgent messages can be sent to your provider as well.   To learn more about what you can do with MyChart, go to [email or web address].    Your next appointment:   12 month(s)  Provider:   You may see Dr. Kirke Corin or one of the following Advanced Practice Providers on your designated Care Team:   Nicolasa Ducking, NP Eula Listen, PA-C Cadence Fransico Michael, PA-C Charlsie Quest, NP Carlos Levering, NP

## 2023-12-10 ENCOUNTER — Other Ambulatory Visit: Payer: Self-pay

## 2023-12-10 ENCOUNTER — Encounter
Admission: RE | Admit: 2023-12-10 | Discharge: 2023-12-10 | Disposition: A | Discharge status: Home / Self Care | Payer: 59 | Source: Ambulatory Visit | Attending: Surgery | Admitting: Surgery

## 2023-12-10 VITALS — Ht 67.0 in | Wt 170.0 lb

## 2023-12-10 DIAGNOSIS — Z01812 Encounter for preprocedural laboratory examination: Secondary | ICD-10-CM

## 2023-12-10 DIAGNOSIS — Z01810 Encounter for preprocedural cardiovascular examination: Secondary | ICD-10-CM

## 2023-12-10 DIAGNOSIS — I10 Essential (primary) hypertension: Secondary | ICD-10-CM

## 2023-12-10 NOTE — Patient Instructions (Addendum)
Your procedure is scheduled on: Friday, February 21 Report to the Registration Desk on the 1st floor of the [facility] To find out your arrival time, please call [phone] between 1PM - 3PM on: Thursday, February 20 If your arrival time is 6:00 am, do not arrive before that time as the [facility] Mall entrance doors do not open until 6:00 am.  REMEMBER: Instructions that are not followed completely may result in serious medical risk, up to and including death; or upon the discretion of your surgeon and anesthesiologist your surgery may need to be rescheduled.  Do not eat food after midnight the night before surgery.  No gum chewing or hard candies.  You may however, drink CLEAR liquids up to 2 hours before you are scheduled to arrive for your surgery. Do not drink anything within 2 hours of your scheduled arrival time.  Clear liquids include: - water  - apple juice without pulp - gatorade (not RED colors) - black coffee or tea (Do NOT add milk or creamers to the coffee or tea) Do NOT drink anything that is not on this list.  One week prior to surgery: starting February 14 Stop aspirin and Anti-inflammatories (NSAIDS) such as Advil, Aleve, Ibuprofen, Motrin, Naproxen, Naprosyn and Aspirin based products such as Excedrin, Goody's Powder, BC Powder. Stop ANY OVER THE COUNTER supplements until after surgery.  You may however, continue to take Tylenol if needed for pain up until the day of surgery.  Continue taking all of your other prescription medications up until the day of surgery.  ON THE DAY OF SURGERY ONLY TAKE THESE MEDICATIONS WITH SIPS OF WATER:  Amlodipine Brimonidine-timolol eye drops  No Alcohol for 24 hours before or after surgery.  No Smoking including e-cigarettes for 24 hours before surgery.  No chewable tobacco products for at least 6 hours before surgery.  No nicotine patches on the day of surgery.  Do not use any "recreational" drugs for at least a week  (preferably 2 weeks) before your surgery.  Please be advised that the combination of cocaine and anesthesia may have negative outcomes, up to and including death. If you test positive for cocaine, your surgery will be cancelled.  On the morning of surgery brush your teeth with toothpaste and water, you may rinse your mouth with mouthwash if you wish. Do not swallow any toothpaste or mouthwash.  Use CHG Soap as directed on instruction sheet.  Do not wear jewelry, make-up, hairpins, clips or nail polish.  For welded (permanent) jewelry: bracelets, anklets, waist bands, etc.  Please have this removed prior to surgery.  If it is not removed, there is a chance that hospital personnel will need to cut it off on the day of surgery.  Do not wear lotions, powders, or perfumes.   Do not shave body hair from the neck down 48 hours before surgery.  Contact lenses, hearing aids and dentures may not be worn into surgery.  Do not bring valuables to the hospital. [facility] is not responsible for any missing/lost belongings or valuables.   Notify your doctor if there is any change in your medical condition (cold, fever, infection).  Wear comfortable clothing (specific to your surgery type) to the hospital.  After surgery, you can help prevent lung complications by doing breathing exercises.  Take deep breaths and cough every 1-2 hours. Your doctor may order a device called an Incentive Spirometer to help you take deep breaths. When coughing or sneezing, hold a pillow firmly against  your incision with both hands. This is called "splinting." Doing this helps protect your incision. It also decreases belly discomfort.  If you are being discharged the day of surgery, you will not be allowed to drive home. You will need a responsible individual to drive you home and stay with you for 24 hours after surgery.   If you are taking public transportation, you will need to have a responsible individual with  you.  Please call the Pre-admissions Testing Dept. at [phone] if you have any questions about these instructions.  Surgery Visitation Policy:  Patients having surgery or a procedure may have two visitors.  Children under the age of 93 must have an adult with them who is not the patient.  Temporary Visitor Restrictions Due to increasing cases of flu, RSV and COVID-19: Children ages 73 and under will not be able to visit patients in [facility] hospitals under most circumstances.      Preparing for Surgery with CHLORHEXIDINE GLUCONATE (CHG) Soap  Chlorhexidine Gluconate (CHG) Soap  o An antiseptic cleaner that kills germs and bonds with the skin to continue killing germs even after washing  o Used for showering the night before surgery and morning of surgery  Before surgery, you can play an important role by reducing the number of germs on your skin.  CHG (Chlorhexidine gluconate) soap is an antiseptic cleanser which kills germs and bonds with the skin to continue killing germs even after washing.  Please do not use if you have an allergy to CHG or antibacterial soaps. If your skin becomes reddened/irritated stop using the CHG.  1. Shower the NIGHT BEFORE SURGERY and the MORNING OF SURGERY with CHG soap.  2. If you choose to wash your hair, wash your hair first as usual with your normal shampoo.  3. After shampooing, rinse your hair and body thoroughly to remove the shampoo.  4. Use CHG as you would any other liquid soap. You can apply CHG directly to the skin and wash gently with a scrungie or a clean washcloth.  5. Apply the CHG soap to your body only from the neck down. Do not use on open wounds or open sores. Avoid contact with your eyes, ears, mouth, and genitals (private parts). Wash face and genitals (private parts) with your normal soap.  6. Wash thoroughly, paying special attention to the area where your surgery will be performed.  7. Thoroughly rinse your body  with warm water.  8. Do not shower/wash with your normal soap after using and rinsing off the CHG soap.  9. Pat yourself dry with a clean towel.  10. Wear clean pajamas to bed the night before surgery.  12. Place clean sheets on your bed the night of your first shower and do not sleep with pets.  13. Shower again with the CHG soap on the day of surgery prior to arriving at the hospital.  14. Do not apply any deodorants/lotions/powders.  15. Please wear clean clothes to the hospital.

## 2023-12-14 ENCOUNTER — Encounter
Admission: RE | Admit: 2023-12-14 | Discharge: 2023-12-14 | Disposition: A | Discharge status: Home / Self Care | Payer: 59 | Source: Ambulatory Visit | Attending: Surgery | Admitting: Surgery

## 2023-12-14 DIAGNOSIS — Z01812 Encounter for preprocedural laboratory examination: Secondary | ICD-10-CM | POA: Diagnosis not present

## 2023-12-14 DIAGNOSIS — I10 Essential (primary) hypertension: Secondary | ICD-10-CM | POA: Diagnosis not present

## 2023-12-14 DIAGNOSIS — Z01818 Encounter for other preprocedural examination: Secondary | ICD-10-CM | POA: Diagnosis not present

## 2023-12-14 LAB — CBC
HCT: 41.9 % (ref 39.0–52.0)
Hemoglobin: 14 g/dL (ref 13.0–17.0)
MCH: 30.2 pg (ref 26.0–34.0)
MCHC: 33.4 g/dL (ref 30.0–36.0)
MCV: 90.3 fL (ref 80.0–100.0)
Platelets: 302 10*3/uL (ref 150–400)
RBC: 4.64 MIL/uL (ref 4.22–5.81)
RDW: 12.9 % (ref 11.5–15.5)
WBC: 6.9 10*3/uL (ref 4.0–10.5)
nRBC: 0 % (ref 0.0–0.2)

## 2023-12-14 LAB — BASIC METABOLIC PANEL
Anion gap: 10 (ref 5–15)
BUN: 14 mg/dL (ref 6–20)
CO2: 26 mmol/L (ref 22–32)
Calcium: 9.3 mg/dL (ref 8.9–10.3)
Chloride: 103 mmol/L (ref 98–111)
Creatinine, Ser: 0.95 mg/dL (ref 0.61–1.24)
GFR, Estimated: >60 mL/min (ref >60)
Glucose, Bld: 101 mg/dL — ABNORMAL HIGH (ref 70–99)
Potassium: 3.7 mmol/L (ref 3.5–5.1)
Sodium: 139 mmol/L (ref 135–145)

## 2023-12-18 ENCOUNTER — Encounter: Admission: RE | Payer: Self-pay | Source: Home / Self Care

## 2023-12-18 ENCOUNTER — Ambulatory Visit: Admission: RE | Admit: 2023-12-18 | Payer: 59 | Source: Home / Self Care | Admitting: Surgery

## 2023-12-18 SURGERY — XI ROBOT ASSISTED UMBILICAL HERNIA REPAIR
Anesthesia: General | Site: Abdomen

## 2024-01-01 ENCOUNTER — Encounter
Admission: RE | Admit: 2024-01-01 | Discharge: 2024-01-01 | Disposition: A | Discharge status: Home / Self Care | Payer: 59 | Source: Ambulatory Visit | Attending: Surgery | Admitting: Surgery

## 2024-01-01 ENCOUNTER — Ambulatory Visit: Payer: Self-pay | Admitting: Surgery

## 2024-01-01 NOTE — H&P (Signed)
 ubjective:    CC: Umbilical hernia without obstruction and without gangrene [K42.9]   HPI:   Subjective Peter Stanton. is a 58 y.o. male who was referred by Sandie Ano, MD for evaluation of above. Aysmptomatic but increasing in size.  Didn't pursue repair last year due to cost issues.   Past Medical History:  has a past medical history of Glaucoma (increased eye pressure) and Hypertension.   Past Surgical History:  Past Surgical History           Past Surgical History:  Procedure Laterality Date   COLONOSCOPY   12/22/2003    Dr. Oval Linsey - Nml   COLONOSCOPY   10/08/2016    Entire examined colon is normal/Repeat 57yrs/MUS        Family History: family history includes Alzheimer's disease in his mother; Hyperlipidemia (Elevated cholesterol) in his mother; Pancreatic cancer in his paternal grandfather.   Social History:  reports that he has never smoked. He uses smokeless tobacco. He reports current alcohol use. He reports that he does not use drugs.   Current Medications: has a current medication list which includes the following prescription(s): amlodipine, aspirin, brimonidine, bupropion hcl (smoking deter), latanoprost, and prednisone.   Allergies:           Allergies as of 10/12/2023 - Reviewed 08/26/2023  Allergen Reaction Noted   Bee sting kit Hives 08/12/2022   Meloxicam Hives and Itching 08/22/2019      ROS:  A 15 point review of systems was performed and pertinent positives and negatives noted in HPI   Objective:      Objective There were no vitals taken for this visit.   Constitutional :  Alert, cooperative, no distress  Lymphatics/Throat:  Supple, no lymphadenopathy  Respiratory:  clear to auscultation bilaterally  Cardiovascular:  regular rate and rhythm  Gastrointestinal: soft, non-tender; bowel sounds normal; no masses,  no organomegaly. umbilical hernia noted.  small, reducible, and no overlying skin changes  Musculoskeletal: Steady gait and  movement  Skin: Cool and moist, no surgical scars  Psychiatric: Normal affect, non-agitated, not confused         LABS:  N/a    RADS: N/a Assessment:      Assessment Umbilical hernia without obstruction and without gangrene [K42.9]   Plan:      Plan 1. Umbilical hernia without obstruction and without gangrene [K42.9]   Discussed the risk of surgery including recurrence, which can be up to 50% in the case of incisional or complex hernias, possible use of prosthetic materials (mesh) and the increased risk of mesh infxn if used, bleeding, chronic pain, post-op infxn, post-op SBO or ileus, and possible re-operation to address said risks. The risks of general anesthetic, if used, includes MI, CVA, sudden death or even reaction to anesthetic medications also discussed. Alternatives include continued observation.  Benefits include possible symptom relief, prevention of incarceration, strangulation, enlargement in size over time, and the risk of emergency surgery in the face of strangulation.    Typical post-op recovery time of 3-5 days with 2 weeks of activity restrictions were also discussed.   ED return precautions given for sudden increase in pain, size of hernia with accompanying fever, nausea, and/or vomiting.   The patient verbalized understanding and all questions were answered to the patient's satisfaction.     2. Patient has elected to proceed with surgical treatment. Procedure will be scheduled. Robotic assisted Laparoscopic per patient preference   labs/images/medications/previous chart entries reviewed personally and relevant  changes/updates noted above

## 2024-01-01 NOTE — Patient Instructions (Signed)
 Your procedure is scheduled on: 01/08/24 - Friday Report to the Registration Desk on the 1st floor of the [facility]. To find out your arrival time, please call [phone] between 1PM - 3PM on:01/07/24 - Thursday If your arrival time is 6:00 am, do not arrive before that time as the Medical Mall entrance doors do not open until 6:00 am.  REMEMBER: Instructions that are not followed completely may result in serious medical risk, up to and including death; or upon the discretion of your surgeon and anesthesiologist your surgery may need to be rescheduled.  Do not eat food after midnight the night before surgery.  No gum chewing or hard candies.  You may however, drink CLEAR liquids up to 2 hours before you are scheduled to arrive for your surgery. Do not drink anything within 2 hours of your scheduled arrival time.  Clear liquids include: - water  - apple juice without pulp - gatorade (not RED colors) - black coffee or tea (Do NOT add milk or creamers to the coffee or tea) Do NOT drink anything that is not on this list.  One week prior to surgery: Stop Anti-inflammatories (NSAIDS) such as Advil, Aleve, Ibuprofen, Motrin, Naproxen, Naprosyn and Aspirin based products such as Excedrin, Goody's Powder, BC Powder. You may take Tylenol if needed for pain up until the day of surgery.  Stop ANY OVER THE COUNTER supplements until after surgery.  HOLD lisinopril (ZESTRIL) on the day of surgery  ON THE DAY OF SURGERY ONLY TAKE THESE MEDICATIONS WITH SIPS OF WATER:  amLODipine (NORVASC)  brimonidine-timolol (COMBIGAN)    No Alcohol for 24 hours before or after surgery.  No Smoking including e-cigarettes for 24 hours before surgery.  No chewable tobacco products for at least 6 hours before surgery.  No nicotine patches on the day of surgery.  Do not use any "recreational" drugs for at least a week (preferably 2 weeks) before your surgery.  Please be advised that the combination of  cocaine and anesthesia may have negative outcomes, up to and including death. If you test positive for cocaine, your surgery will be cancelled.  On the morning of surgery brush your teeth with toothpaste and water, you may rinse your mouth with mouthwash if you wish. Do not swallow any toothpaste or mouthwash.  Use CHG Soap or wipes as directed on instruction sheet.  Do not wear jewelry, make-up, hairpins, clips or nail polish.  For welded (permanent) jewelry: bracelets, anklets, waist bands, etc.  Please have this removed prior to surgery.  If it is not removed, there is a chance that hospital personnel will need to cut it off on the day of surgery.  Do not wear lotions, powders, or perfumes.   Do not shave body hair from the neck down 48 hours before surgery.  Contact lenses, hearing aids and dentures may not be worn into surgery.  Do not bring valuables to the hospital. [facility] is not responsible for any missing/lost belongings or valuables.   Notify your doctor if there is any change in your medical condition (cold, fever, infection).  Wear comfortable clothing (specific to your surgery type) to the hospital.  After surgery, you can help prevent lung complications by doing breathing exercises.  Take deep breaths and cough every 1-2 hours. Your doctor may order a device called an Incentive Spirometer to help you take deep breaths. When coughing or sneezing, hold a pillow firmly against your incision with both hands. This is called "splinting." Doing  this helps protect your incision. It also decreases belly discomfort.  If you are being admitted to the hospital overnight, leave your suitcase in the car. After surgery it may be brought to your room.  In case of increased patient census, it may be necessary for you, the patient, to continue your postoperative care in the Same Day Surgery department.  If you are being discharged the day of surgery, you will not be allowed to drive  home. You will need a responsible individual to drive you home and stay with you for 24 hours after surgery.   If you are taking public transportation, you will need to have a responsible individual with you.  Please call the Pre-admissions Testing Dept. at [phone] if you have any questions about these instructions.  Surgery Visitation Policy:  Patients having surgery or a procedure may have two visitors.  Children under the age of 40 must have an adult with them who is not the patient.  Temporary Visitor Restrictions Due to increasing cases of flu, RSV and COVID-19: Children ages 78 and under will not be able to visit patients in [facility] hospitals under most circumstances.  Inpatient Visitation:    Visiting hours are 7 a.m. to 8 p.m. Up to four visitors are allowed at one time in a patient room. The visitors may rotate out with other people during the day.  One visitor age 38 or older may stay with the patient overnight and must be in the room by 8 p.m.

## 2024-01-01 NOTE — H&P (View-Only) (Signed)
 ubjective:    CC: Umbilical hernia without obstruction and without gangrene [K42.9]   HPI:   Subjective Peter Stanton. is a 58 y.o. male who was referred by Sandie Ano, MD for evaluation of above. Aysmptomatic but increasing in size.  Didn't pursue repair last year due to cost issues.   Past Medical History:  has a past medical history of Glaucoma (increased eye pressure) and Hypertension.   Past Surgical History:  Past Surgical History           Past Surgical History:  Procedure Laterality Date   COLONOSCOPY   12/22/2003    Dr. Oval Linsey - Nml   COLONOSCOPY   10/08/2016    Entire examined colon is normal/Repeat 57yrs/MUS        Family History: family history includes Alzheimer's disease in his mother; Hyperlipidemia (Elevated cholesterol) in his mother; Pancreatic cancer in his paternal grandfather.   Social History:  reports that he has never smoked. He uses smokeless tobacco. He reports current alcohol use. He reports that he does not use drugs.   Current Medications: has a current medication list which includes the following prescription(s): amlodipine, aspirin, brimonidine, bupropion hcl (smoking deter), latanoprost, and prednisone.   Allergies:           Allergies as of 10/12/2023 - Reviewed 08/26/2023  Allergen Reaction Noted   Bee sting kit Hives 08/12/2022   Meloxicam Hives and Itching 08/22/2019      ROS:  A 15 point review of systems was performed and pertinent positives and negatives noted in HPI   Objective:      Objective There were no vitals taken for this visit.   Constitutional :  Alert, cooperative, no distress  Lymphatics/Throat:  Supple, no lymphadenopathy  Respiratory:  clear to auscultation bilaterally  Cardiovascular:  regular rate and rhythm  Gastrointestinal: soft, non-tender; bowel sounds normal; no masses,  no organomegaly. umbilical hernia noted.  small, reducible, and no overlying skin changes  Musculoskeletal: Steady gait and  movement  Skin: Cool and moist, no surgical scars  Psychiatric: Normal affect, non-agitated, not confused         LABS:  N/a    RADS: N/a Assessment:      Assessment Umbilical hernia without obstruction and without gangrene [K42.9]   Plan:      Plan 1. Umbilical hernia without obstruction and without gangrene [K42.9]   Discussed the risk of surgery including recurrence, which can be up to 50% in the case of incisional or complex hernias, possible use of prosthetic materials (mesh) and the increased risk of mesh infxn if used, bleeding, chronic pain, post-op infxn, post-op SBO or ileus, and possible re-operation to address said risks. The risks of general anesthetic, if used, includes MI, CVA, sudden death or even reaction to anesthetic medications also discussed. Alternatives include continued observation.  Benefits include possible symptom relief, prevention of incarceration, strangulation, enlargement in size over time, and the risk of emergency surgery in the face of strangulation.    Typical post-op recovery time of 3-5 days with 2 weeks of activity restrictions were also discussed.   ED return precautions given for sudden increase in pain, size of hernia with accompanying fever, nausea, and/or vomiting.   The patient verbalized understanding and all questions were answered to the patient's satisfaction.     2. Patient has elected to proceed with surgical treatment. Procedure will be scheduled. Robotic assisted Laparoscopic per patient preference   labs/images/medications/previous chart entries reviewed personally and relevant  changes/updates noted above

## 2024-01-01 NOTE — Pre-Procedure Instructions (Signed)
 Follow up call completed with patient, instructions reviewed.

## 2024-01-07 MED ORDER — ACETAMINOPHEN 500 MG PO TABS
1000.0000 mg | ORAL_TABLET | ORAL | Status: AC
  Administered 2024-01-08: 1000 mg via ORAL

## 2024-01-07 MED ORDER — CHLORHEXIDINE GLUCONATE CLOTH 2 % EX PADS
6.0000 | MEDICATED_PAD | Freq: Once | CUTANEOUS | Status: AC
  Administered 2024-01-08: 6 via TOPICAL

## 2024-01-07 MED ORDER — GABAPENTIN 300 MG PO CAPS
300.0000 mg | ORAL_CAPSULE | ORAL | Status: AC
  Administered 2024-01-08: 300 mg via ORAL

## 2024-01-07 MED ORDER — CEFAZOLIN SODIUM-DEXTROSE 2-4 GM/100ML-% IV SOLN
2.0000 g | INTRAVENOUS | Status: AC
  Administered 2024-01-08: 2 g via INTRAVENOUS

## 2024-01-07 MED ORDER — CHLORHEXIDINE GLUCONATE 0.12 % MT SOLN
15.0000 mL | Freq: Once | OROMUCOSAL | Status: AC
  Administered 2024-01-08: 15 mL via OROMUCOSAL

## 2024-01-07 MED ORDER — ORAL CARE MOUTH RINSE: 15.0000 mL | Freq: Once | OROMUCOSAL | Status: AC

## 2024-01-08 ENCOUNTER — Other Ambulatory Visit: Payer: Self-pay

## 2024-01-08 ENCOUNTER — Ambulatory Visit: Payer: Self-pay | Admitting: Urgent Care

## 2024-01-08 ENCOUNTER — Encounter: Payer: Self-pay | Admitting: Surgery

## 2024-01-08 ENCOUNTER — Ambulatory Visit: Admitting: Anesthesiology

## 2024-01-08 ENCOUNTER — Encounter
Admission: RE | Disposition: A | Discharge status: Home / Self Care | Payer: Self-pay | Source: Home / Self Care | Attending: Surgery

## 2024-01-08 ENCOUNTER — Ambulatory Visit
Admission: RE | Admit: 2024-01-08 | Discharge: 2024-01-08 | Disposition: A | Discharge status: Home / Self Care | Payer: 59 | Attending: Surgery | Admitting: Surgery

## 2024-01-08 DIAGNOSIS — R7303 Prediabetes: Secondary | ICD-10-CM | POA: Diagnosis not present

## 2024-01-08 DIAGNOSIS — F17220 Nicotine dependence, chewing tobacco, uncomplicated: Secondary | ICD-10-CM | POA: Diagnosis not present

## 2024-01-08 DIAGNOSIS — I10 Essential (primary) hypertension: Secondary | ICD-10-CM | POA: Insufficient documentation

## 2024-01-08 DIAGNOSIS — H547 Unspecified visual loss: Secondary | ICD-10-CM | POA: Insufficient documentation

## 2024-01-08 DIAGNOSIS — K429 Umbilical hernia without obstruction or gangrene: Secondary | ICD-10-CM | POA: Diagnosis not present

## 2024-01-08 HISTORY — PX: INSERTION OF MESH: SHX5868

## 2024-01-08 SURGERY — REPAIR, HERNIA, UMBILICAL, ROBOT-ASSISTED
Anesthesia: General | Site: Abdomen

## 2024-01-08 MED ORDER — ROCURONIUM BROMIDE 10 MG/ML (PF) SYRINGE
PREFILLED_SYRINGE | INTRAVENOUS | Status: AC
  Filled 2024-01-08: qty 10

## 2024-01-08 MED ORDER — SODIUM CHLORIDE 0.9 % IV SOLN
INTRAVENOUS | Status: DC | PRN
  Administered 2024-01-08: 50 mL

## 2024-01-08 MED ORDER — CHLORHEXIDINE GLUCONATE 0.12 % MT SOLN
OROMUCOSAL | Status: AC
  Filled 2024-01-08: qty 15

## 2024-01-08 MED ORDER — CEFAZOLIN SODIUM-DEXTROSE 2-4 GM/100ML-% IV SOLN
INTRAVENOUS | Status: AC
  Filled 2024-01-08: qty 100

## 2024-01-08 MED ORDER — ONDANSETRON HCL 4 MG/2ML IJ SOLN
INTRAMUSCULAR | Status: AC
  Filled 2024-01-08: qty 2

## 2024-01-08 MED ORDER — DEXAMETHASONE SODIUM PHOSPHATE 10 MG/ML IJ SOLN
INTRAMUSCULAR | Status: DC | PRN
  Administered 2024-01-08: 8 mg via INTRAVENOUS

## 2024-01-08 MED ORDER — FENTANYL CITRATE (PF) 100 MCG/2ML IJ SOLN
INTRAMUSCULAR | Status: AC
  Filled 2024-01-08: qty 2

## 2024-01-08 MED ORDER — LIDOCAINE HCL (CARDIAC) PF 100 MG/5ML IV SOSY
PREFILLED_SYRINGE | INTRAVENOUS | Status: DC | PRN
  Administered 2024-01-08: 80 mg via INTRAVENOUS

## 2024-01-08 MED ORDER — PROPOFOL 10 MG/ML IV BOLUS
INTRAVENOUS | Status: DC | PRN
  Administered 2024-01-08: 150 mg via INTRAVENOUS

## 2024-01-08 MED ORDER — ONDANSETRON HCL 4 MG/2ML IJ SOLN: 4.0000 mg | Freq: Once | INTRAMUSCULAR | Status: DC | PRN

## 2024-01-08 MED ORDER — GABAPENTIN 300 MG PO CAPS
ORAL_CAPSULE | ORAL | Status: AC
  Filled 2024-01-08: qty 1

## 2024-01-08 MED ORDER — BUPIVACAINE-EPINEPHRINE 0.5% -1:200000 IJ SOLN
INTRAMUSCULAR | Status: DC | PRN
  Administered 2024-01-08: 30 mL

## 2024-01-08 MED ORDER — BUPIVACAINE-EPINEPHRINE (PF) 0.5% -1:200000 IJ SOLN
INTRAMUSCULAR | Status: AC
  Filled 2024-01-08: qty 30

## 2024-01-08 MED ORDER — SUGAMMADEX SODIUM 200 MG/2ML IV SOLN
INTRAVENOUS | Status: AC
  Filled 2024-01-08: qty 2

## 2024-01-08 MED ORDER — OXYCODONE HCL 5 MG/5ML PO SOLN: 5.0000 mg | Freq: Once | ORAL | Status: AC | PRN

## 2024-01-08 MED ORDER — MIDAZOLAM HCL 2 MG/2ML IJ SOLN
INTRAMUSCULAR | Status: DC | PRN
  Administered 2024-01-08: 2 mg via INTRAVENOUS

## 2024-01-08 MED ORDER — MIDAZOLAM HCL 2 MG/2ML IJ SOLN
INTRAMUSCULAR | Status: AC
Start: 2024-01-08 — End: ?
  Filled 2024-01-08: qty 2

## 2024-01-08 MED ORDER — ACETAMINOPHEN 325 MG PO TABS: 650.0000 mg | ORAL_TABLET | Freq: Three times a day (TID) | ORAL | 0 refills | Status: AC | PRN

## 2024-01-08 MED ORDER — PHENYLEPHRINE 80 MCG/ML (10ML) SYRINGE FOR IV PUSH (FOR BLOOD PRESSURE SUPPORT)
PREFILLED_SYRINGE | INTRAVENOUS | Status: DC | PRN
  Administered 2024-01-08: 80 ug via INTRAVENOUS
  Administered 2024-01-08 (×2): 160 ug via INTRAVENOUS

## 2024-01-08 MED ORDER — OXYCODONE HCL 5 MG PO TABS
ORAL_TABLET | ORAL | Status: AC
  Filled 2024-01-08: qty 1

## 2024-01-08 MED ORDER — KETOROLAC TROMETHAMINE 30 MG/ML IJ SOLN
INTRAMUSCULAR | Status: DC | PRN
  Administered 2024-01-08: 30 mg via INTRAVENOUS

## 2024-01-08 MED ORDER — ONDANSETRON HCL 4 MG/2ML IJ SOLN
INTRAMUSCULAR | Status: DC | PRN
  Administered 2024-01-08: 4 mg via INTRAVENOUS

## 2024-01-08 MED ORDER — FENTANYL CITRATE (PF) 100 MCG/2ML IJ SOLN
INTRAMUSCULAR | Status: DC | PRN
  Administered 2024-01-08: 50 ug via INTRAVENOUS

## 2024-01-08 MED ORDER — OXYCODONE HCL 5 MG PO TABS
5.0000 mg | ORAL_TABLET | Freq: Once | ORAL | Status: AC | PRN
  Administered 2024-01-08: 5 mg via ORAL

## 2024-01-08 MED ORDER — DOCUSATE SODIUM 100 MG PO CAPS: 100.0000 mg | ORAL_CAPSULE | Freq: Two times a day (BID) | ORAL | 0 refills | Status: AC | PRN

## 2024-01-08 MED ORDER — PROPOFOL 10 MG/ML IV BOLUS
INTRAVENOUS | Status: AC
  Filled 2024-01-08: qty 20

## 2024-01-08 MED ORDER — FENTANYL CITRATE (PF) 100 MCG/2ML IJ SOLN: 25.0000 ug | INTRAMUSCULAR | Status: DC | PRN

## 2024-01-08 MED ORDER — LACTATED RINGERS IV SOLN: INTRAVENOUS | Status: DC | PRN

## 2024-01-08 MED ORDER — LIDOCAINE HCL (PF) 2 % IJ SOLN
INTRAMUSCULAR | Status: AC
  Filled 2024-01-08: qty 5

## 2024-01-08 MED ORDER — KETOROLAC TROMETHAMINE 30 MG/ML IJ SOLN
INTRAMUSCULAR | Status: AC
  Filled 2024-01-08: qty 1

## 2024-01-08 MED ORDER — ACETAMINOPHEN 10 MG/ML IV SOLN: 1000.0000 mg | Freq: Once | INTRAVENOUS | Status: DC | PRN

## 2024-01-08 MED ORDER — SODIUM CHLORIDE (PF) 0.9 % IJ SOLN
INTRAMUSCULAR | Status: AC
  Filled 2024-01-08: qty 30

## 2024-01-08 MED ORDER — PHENYLEPHRINE 80 MCG/ML (10ML) SYRINGE FOR IV PUSH (FOR BLOOD PRESSURE SUPPORT)
PREFILLED_SYRINGE | INTRAVENOUS | Status: AC
  Filled 2024-01-08: qty 10

## 2024-01-08 MED ORDER — LACTATED RINGERS IV SOLN: INTRAVENOUS | Status: DC

## 2024-01-08 MED ORDER — ROCURONIUM BROMIDE 100 MG/10ML IV SOLN
INTRAVENOUS | Status: DC | PRN
  Administered 2024-01-08: 40 mg via INTRAVENOUS
  Administered 2024-01-08: 10 mg via INTRAVENOUS
  Administered 2024-01-08: 50 mg via INTRAVENOUS

## 2024-01-08 MED ORDER — BUPIVACAINE LIPOSOME 1.3 % IJ SUSP
INTRAMUSCULAR | Status: AC
  Filled 2024-01-08: qty 20

## 2024-01-08 MED ORDER — OXYCODONE-ACETAMINOPHEN 5-325 MG PO TABS: 1.0000 | ORAL_TABLET | Freq: Three times a day (TID) | ORAL | 0 refills | Status: AC | PRN

## 2024-01-08 MED ORDER — DEXAMETHASONE SODIUM PHOSPHATE 10 MG/ML IJ SOLN
INTRAMUSCULAR | Status: AC
Start: 2024-01-08 — End: ?
  Filled 2024-01-08: qty 1

## 2024-01-08 MED ORDER — SUGAMMADEX SODIUM 200 MG/2ML IV SOLN
INTRAVENOUS | Status: DC | PRN
  Administered 2024-01-08: 200 mg via INTRAVENOUS

## 2024-01-08 MED ORDER — ACETAMINOPHEN 500 MG PO TABS
ORAL_TABLET | ORAL | Status: AC
  Filled 2024-01-08: qty 2

## 2024-01-08 SURGICAL SUPPLY — 43 items
COVER TIP SHEARS 8 DVNC (MISCELLANEOUS) ×2 IMPLANT
COVER WAND RF STERILE (DRAPES) ×2 IMPLANT
DERMABOND ADVANCED .7 DNX12 (GAUZE/BANDAGES/DRESSINGS) ×2 IMPLANT
DRAPE ARM DVNC X/XI (DISPOSABLE) ×6 IMPLANT
DRAPE COLUMN DVNC XI (DISPOSABLE) ×2 IMPLANT
ELECT REM PT RETURN 9FT ADLT (ELECTROSURGICAL) ×2 IMPLANT
ELECTRODE REM PT RTRN 9FT ADLT (ELECTROSURGICAL) ×2 IMPLANT
FORCEPS BPLR FENES DVNC XI (FORCEP) ×2 IMPLANT
GLOVE BIOGEL PI IND STRL 7.0 (GLOVE) ×4 IMPLANT
GLOVE SURG SYN 6.5 ES PF (GLOVE) ×8 IMPLANT
GLOVE SURG SYN 6.5 PF PI (GLOVE) ×4 IMPLANT
GOWN STRL REUS W/ TWL LRG LVL3 (GOWN DISPOSABLE) ×6 IMPLANT
GRASPER SUT TROCAR 14GX15 (MISCELLANEOUS) IMPLANT
IRRIGATOR SUCT 8 DISP DVNC XI (IRRIGATION / IRRIGATOR) IMPLANT
IV NS 1000ML BAXH (IV SOLUTION) IMPLANT
LABEL OR SOLS (LABEL) ×2 IMPLANT
MANIFOLD NEPTUNE II (INSTRUMENTS) ×2 IMPLANT
MESH PROGRIP HERNIA FLAT 15X15 (Mesh General) IMPLANT
NDL DRIVE SUT CUT DVNC (INSTRUMENTS) ×2 IMPLANT
NDL HYPO 22X1.5 SAFETY MO (MISCELLANEOUS) ×2 IMPLANT
NDL INSUFFLATION 14GA 120MM (NEEDLE) ×2 IMPLANT
NEEDLE DRIVE SUT CUT DVNC (INSTRUMENTS) ×2 IMPLANT
NEEDLE HYPO 22X1.5 SAFETY MO (MISCELLANEOUS) ×2 IMPLANT
NEEDLE INSUFFLATION 14GA 120MM (NEEDLE) ×2 IMPLANT
OBTURATOR OPTICAL STND 8 DVNC (TROCAR) ×2 IMPLANT
OBTURATOR OPTICALSTD 8 DVNC (TROCAR) ×2 IMPLANT
PACK LAP CHOLECYSTECTOMY (MISCELLANEOUS) ×2 IMPLANT
SCISSORS MNPLR CVD DVNC XI (INSTRUMENTS) ×2 IMPLANT
SEAL UNIV 5-12 XI (MISCELLANEOUS) ×6 IMPLANT
SET TUBE SMOKE EVAC HIGH FLOW (TUBING) ×2 IMPLANT
SOL ELECTROSURG ANTI STICK (MISCELLANEOUS) ×2 IMPLANT
SOLUTION ELECTROSURG ANTI STCK (MISCELLANEOUS) ×2 IMPLANT
SUT MNCRL AB 4-0 PS2 18 (SUTURE) ×2 IMPLANT
SUT STRATA 3-0 30 PS-1 (SUTURE) IMPLANT
SUT STRATAFIX 0 PDS+ CT-2 23 (SUTURE) ×2 IMPLANT
SUT VIC AB 3-0 SH 27X BRD (SUTURE) IMPLANT
SUT VICRYL 0 UR6 27IN ABS (SUTURE) ×2 IMPLANT
SUTURE STRATFX 0 PDS+ CT-2 23 (SUTURE) ×2 IMPLANT
SYR 30ML LL (SYRINGE) ×2 IMPLANT
SYSTEM WECK SHIELD CLOSURE (TROCAR) IMPLANT
TRAP FLUID SMOKE EVACUATOR (MISCELLANEOUS) ×2 IMPLANT
TRAY FOLEY MTR SLVR 16FR STAT (SET/KITS/TRAYS/PACK) ×2 IMPLANT
WATER STERILE IRR 500ML POUR (IV SOLUTION) ×2 IMPLANT

## 2024-01-08 NOTE — Anesthesia Procedure Notes (Signed)
 Procedure Name: Intubation Date/Time: 01/08/2024 12:46 PM  Performed by: Malva Cogan, CRNAPre-anesthesia Checklist: Patient identified, Patient being monitored, Timeout performed, Emergency Drugs available and Suction available Patient Re-evaluated:Patient Re-evaluated prior to induction Oxygen Delivery Method: Circle system utilized Preoxygenation: Pre-oxygenation with 100% oxygen Induction Type: IV induction Ventilation: Mask ventilation without difficulty Laryngoscope Size: 3 and McGrath Grade View: Grade I Tube type: Oral Tube size: 7.0 mm Number of attempts: 1 Airway Equipment and Method: Stylet Placement Confirmation: ETT inserted through vocal cords under direct vision, positive ETCO2 and breath sounds checked- equal and bilateral Secured at: 22 cm Tube secured with: Tape Dental Injury: Teeth and Oropharynx as per pre-operative assessment

## 2024-01-08 NOTE — Anesthesia Preprocedure Evaluation (Addendum)
 Anesthesia Evaluation  Patient identified by MRN, date of birth, ID band Patient awake    Reviewed: Allergy & Precautions, NPO status , Patient's Chart, lab work & pertinent test results  History of Anesthesia Complications Negative for: history of anesthetic complications  Airway Mallampati: I   Neck ROM: Full    Dental  (+) Missing   Pulmonary neg pulmonary ROS   Pulmonary exam normal breath sounds clear to auscultation       Cardiovascular hypertension, Normal cardiovascular exam Rhythm:Regular Rate:Normal  ECG 11/17/23: normal   Neuro/Psych Chewing tobacco use; alcohol use disorder approx 3 beers per day; retinal vein occlusion 11 years ago with minor residual vision loss    GI/Hepatic negative GI ROS,,,  Endo/Other  Prediabetes   Renal/GU negative Renal ROS     Musculoskeletal   Abdominal   Peds  Hematology  (+) Blood dyscrasia, anemia   Anesthesia Other Findings   Reproductive/Obstetrics                             Anesthesia Physical Anesthesia Plan  ASA: 2  Anesthesia Plan: General   Post-op Pain Management:    Induction: Intravenous and Rapid sequence  PONV Risk Score and Plan: 2 and Ondansetron, Dexamethasone and Treatment may vary due to age or medical condition  Airway Management Planned: Oral ETT  Additional Equipment:   Intra-op Plan:   Post-operative Plan: Extubation in OR  Informed Consent: I have reviewed the patients History and Physical, chart, labs and discussed the procedure including the risks, benefits and alternatives for the proposed anesthesia with the patient or authorized representative who has indicated his/her understanding and acceptance.     Dental advisory given  Plan Discussed with: CRNA  Anesthesia Plan Comments: (Patient consented for risks of anesthesia including but not limited to:  - adverse reactions to medications - damage to  eyes, teeth, lips or other oral mucosa - nerve damage due to positioning  - sore throat or hoarseness - damage to heart, brain, nerves, lungs, other parts of body or loss of life  Informed patient about role of CRNA in peri- and intra-operative care.  Patient voiced understanding.)        Anesthesia Quick Evaluation

## 2024-01-08 NOTE — Discharge Instructions (Signed)
 Hernia repair, Care After This sheet gives you information about how to care for yourself after your procedure. Your health care provider may also give you more specific instructions. If you have problems or questions, contact your health care provider. What can I expect after the procedure? After your procedure, it is common to have the following: Pain in your abdomen, especially in the incision areas. You will be given medicine to control the pain. Tiredness. This is a normal part of the recovery process. Your energy level will return to normal over the next several weeks. Changes in your bowel movements, such as constipation or needing to go more often. Talk with your health care provider about how to manage this. Follow these instructions at home: Medicines  tylenol as needed for discomfort.   Use narcotics, if prescribed, only when tylenol is not enough to control pain.  325-650mg  every 8hrs to max of 3000mg /24hrs (including the 325mg  in every norco dose) for the tylenol.   Resume aspirin in 48 hours PLEASE RECORD NUMBER OF PILLS TAKEN UNTIL NEXT FOLLOW UP APPT.  THIS WILL HELP DETERMINE HOW READY YOU ARE TO BE RELEASED FROM ANY ACTIVITY RESTRICTIONS Do not drive or use heavy machinery while taking prescription pain medicine. Do not drink alcohol while taking prescription pain medicine.  Incision care    Follow instructions from your health care provider about how to take care of your incision areas. Make sure you: Keep your incisions clean and dry. Wash your hands with soap and water before and after applying medicine to the areas, and before and after changing your bandage (dressing). If soap and water are not available, use hand sanitizer. Change your dressing as told by your health care provider. Leave stitches (sutures), skin glue, or adhesive strips in place. These skin closures may need to stay in place for 2 weeks or longer. If adhesive strip edges start to loosen and curl up,  you may trim the loose edges. Do not remove adhesive strips completely unless your health care provider tells you to do that. Do not wear tight clothing over the incisions. Tight clothing may rub and irritate the incision areas, which may cause the incisions to open. Do not take baths, swim, or use a hot tub until your health care provider approves. OK TO SHOWER IN 24HRS.   Check your incision area every day for signs of infection. Check for: More redness, swelling, or pain. More fluid or blood. Warmth. Pus or a bad smell. Activity Avoid lifting anything that is heavier than 10 lb (4.5 kg) for 2 weeks or until your health care provider says it is okay. No pushing/pulling greater than 30lbs You may resume normal activities as told by your health care provider. Ask your health care provider what activities are safe for you. Take rest breaks during the day as needed. Eating and drinking Follow instructions from your health care provider about what you can eat after surgery. To prevent or treat constipation while you are taking prescription pain medicine, your health care provider may recommend that you: Drink enough fluid to keep your urine clear or pale yellow. Take over-the-counter or prescription medicines. Eat foods that are high in fiber, such as fresh fruits and vegetables, whole grains, and beans. Limit foods that are high in fat and processed sugars, such as fried and sweet foods. General instructions Ask your health care provider when you will need an appointment to get your sutures or staples removed. Keep all follow-up visits as told  by your health care provider. This is important. Contact a health care provider if: You have more redness, swelling, or pain around your incisions. You have more fluid or blood coming from the incisions. Your incisions feel warm to the touch. You have pus or a bad smell coming from your incisions or your dressing. You have a fever. You have an  incision that breaks open (edges not staying together) after sutures or staples have been removed. You develop a rash. You have chest pain or difficulty breathing. You have pain or swelling in your legs. You feel light-headed or you faint. Your abdomen swells (becomes distended). You have nausea or vomiting. You have blood in your stool (feces). This information is not intended to replace advice given to you by your health care provider. Make sure you discuss any questions you have with your health care provider. Document Released: 05/02/2005 Document Revised: 07/02/2018 Document Reviewed: 07/14/2016 Elsevier Interactive Patient Education  2019 [employer].

## 2024-01-08 NOTE — Transfer of Care (Signed)
 Immediate Anesthesia Transfer of Care Note  Patient: Peter Stanton  Procedure(s) Performed: REPAIR, HERNIA, UMBILICAL, ROBOT-ASSISTED (Abdomen) INSERTION OF MESH  Patient Location: PACU  Anesthesia Type:General  Level of Consciousness: awake, drowsy, and patient cooperative  Airway & Oxygen Therapy: Patient Spontanous Breathing and Patient connected to face mask oxygen  Post-op Assessment: Report given to RN and Post -op Vital signs reviewed and stable  Post vital signs: Reviewed and stable  Last Vitals:  Vitals Value Taken Time  BP 123/75 01/08/24 1430  Temp 36.8 C 01/08/24 1430  Pulse 57 01/08/24 1432  Resp 13 01/08/24 1432  SpO2 100 % 01/08/24 1432  Vitals shown include unfiled device data.  Last Pain:  Vitals:   01/08/24 1430  TempSrc:   PainSc: Asleep         Complications: No notable events documented.

## 2024-01-08 NOTE — Op Note (Signed)
 Preoperative diagnosis: Umbilical, initial, reducible hernia Postoperative diagnosis: same  Procedure: Robotic assisted laparoscopic umbilical hernia repair with mesh  Anesthesia: general  Surgeon: Sung Amabile  Wound Classification: Clean  Specimen: none  Complications: None  Estimated Blood Loss: 10ml  Indications:see HPI  Findings: 2 cm x 2 cm hernia defect 2. Tension free repair achieved with ProGrip mesh and suture 3. Adequate hemostasis  Description of procedure: The patient was brought to the operating room and general anesthesia was induced. A time-out was completed verifying correct patient, procedure, site, positioning, and implant(s) and/or special equipment prior to beginning this procedure. Antibiotics were administered prior to making the incision. SCDs placed. The anterior abdominal wall was prepped and draped in the standard sterile fashion.   Palmer's point chosen for entry.  Veress needle placed and abdomen insufflated to 15cm without any dramatic increase in pressure.  Needle removed and a 8 mm port placed lateral to the umbilicus on the left side via Optiview technique.  No bowel injury noted.   additional ports, 8mm and 12mm, along left lateral aspect placed.  Exparel used as tap block under direct visualization.   Xi robot then docked into place.  Umbilical hernia defect measuring 2 cm x 2 cm was noted.  Preperitoneal plane was entered by making a incision along the right lateral aspect of the peritoneum.  This flap was carried across the abdomen to the other side of the defect, reducing all hernia contents and preperitoneal lipoma within the hernia defect.  Small amounts of bleeding was controlled with cautery.  Once adequate exposure of the defect and adequate space was created to place the mesh, insufflation dropped to 8mm and transfacial suture with 0 stratafix used to primarily close defect under minimal tension.  Overlying skin was tacked with suture to secure  umbilical stalk to fascia.   ProGrip mesh cut to size with adequate overlap around the defect edges was placed within the abdominal cavity through 12mm port and secured to the abdominal wall centered over the defect. The peritoneal flap was then closed with a running 3-0 V lock.  Robot was undocked.  The 12mm cannula was removed and port site was closed using PMI device and 0 vicryl suture, ensuring no bowels were injured during this process.  Abdomen then desufflated while camera within abdomen to ensure no signs of new bleed prior to removing camera and rest of ports completely.  All skin incisions closed with runninrg 4-0 Monocryl in a subcuticular fashion.  All wounds then dressed with Dermabond.  Patient was then successfully awakened and transferred to PACU in stable condition.  At the end of the procedure sponge and instrument counts were correct.

## 2024-01-08 NOTE — Interval H&P Note (Signed)
 No change. OK to proceed.

## 2024-01-11 ENCOUNTER — Encounter: Payer: Self-pay | Admitting: Surgery

## 2024-01-15 NOTE — Anesthesia Postprocedure Evaluation (Signed)
 Anesthesia Post Note  Patient: Peter Stanton  Procedure(s) Performed: REPAIR, HERNIA, UMBILICAL, ROBOT-ASSISTED (Abdomen) INSERTION OF MESH  Patient location during evaluation: PACU Anesthesia Type: General Level of consciousness: awake Pain management: satisfactory to patient Vital Signs Assessment: post-procedure vital signs reviewed and stable Respiratory status: spontaneous breathing Cardiovascular status: stable Anesthetic complications: no   No notable events documented.   Last Vitals:  Vitals:   01/08/24 1456 01/08/24 1513  BP: 125/71   Pulse: (!) 58 79  Resp: 14 16  Temp: (!) 36.1 C 36.6 C  SpO2: 100% 100%    Last Pain:  Vitals:   01/08/24 1513  TempSrc: Temporal  PainSc: 2                  VAN STAVEREN,Linsy Ehresman

## 2024-01-20 DIAGNOSIS — K429 Umbilical hernia without obstruction or gangrene: Secondary | ICD-10-CM | POA: Diagnosis not present

## 2024-01-25 DIAGNOSIS — Z872 Personal history of diseases of the skin and subcutaneous tissue: Secondary | ICD-10-CM | POA: Diagnosis not present

## 2024-01-25 DIAGNOSIS — Z86018 Personal history of other benign neoplasm: Secondary | ICD-10-CM | POA: Diagnosis not present

## 2024-01-25 DIAGNOSIS — D492 Neoplasm of unspecified behavior of bone, soft tissue, and skin: Secondary | ICD-10-CM | POA: Diagnosis not present

## 2024-01-25 DIAGNOSIS — L578 Other skin changes due to chronic exposure to nonionizing radiation: Secondary | ICD-10-CM | POA: Diagnosis not present

## 2024-01-28 DIAGNOSIS — H401133 Primary open-angle glaucoma, bilateral, severe stage: Secondary | ICD-10-CM | POA: Diagnosis not present

## 2024-01-28 DIAGNOSIS — H34811 Central retinal vein occlusion, right eye, with macular edema: Secondary | ICD-10-CM | POA: Diagnosis not present

## 2024-02-15 DIAGNOSIS — R059 Cough, unspecified: Secondary | ICD-10-CM | POA: Diagnosis not present

## 2024-05-19 DIAGNOSIS — H34811 Central retinal vein occlusion, right eye, with macular edema: Secondary | ICD-10-CM | POA: Diagnosis not present

## 2024-05-19 DIAGNOSIS — Z9889 Other specified postprocedural states: Secondary | ICD-10-CM | POA: Diagnosis not present

## 2024-05-19 DIAGNOSIS — H401133 Primary open-angle glaucoma, bilateral, severe stage: Secondary | ICD-10-CM | POA: Diagnosis not present

## 2024-07-27 DIAGNOSIS — E538 Deficiency of other specified B group vitamins: Secondary | ICD-10-CM | POA: Diagnosis not present

## 2024-07-27 DIAGNOSIS — Z0000 Encounter for general adult medical examination without abnormal findings: Secondary | ICD-10-CM | POA: Diagnosis not present

## 2024-07-27 DIAGNOSIS — Z125 Encounter for screening for malignant neoplasm of prostate: Secondary | ICD-10-CM | POA: Diagnosis not present

## 2024-07-27 DIAGNOSIS — Z13220 Encounter for screening for lipoid disorders: Secondary | ICD-10-CM | POA: Diagnosis not present

## 2024-08-03 DIAGNOSIS — I10 Essential (primary) hypertension: Secondary | ICD-10-CM | POA: Diagnosis not present

## 2024-08-03 DIAGNOSIS — Z1331 Encounter for screening for depression: Secondary | ICD-10-CM | POA: Diagnosis not present

## 2024-08-03 DIAGNOSIS — Z0000 Encounter for general adult medical examination without abnormal findings: Secondary | ICD-10-CM | POA: Diagnosis not present

## 2024-08-03 DIAGNOSIS — B356 Tinea cruris: Secondary | ICD-10-CM | POA: Diagnosis not present

## 2024-09-19 DIAGNOSIS — H401133 Primary open-angle glaucoma, bilateral, severe stage: Secondary | ICD-10-CM | POA: Diagnosis not present

## 2024-09-19 DIAGNOSIS — H34811 Central retinal vein occlusion, right eye, with macular edema: Secondary | ICD-10-CM | POA: Diagnosis not present

## 2024-11-21 ENCOUNTER — Other Ambulatory Visit: Payer: Self-pay | Admitting: Cardiovascular Disease

## 2024-12-06 ENCOUNTER — Ambulatory Visit: Payer: Self-pay | Admitting: Cardiovascular Disease
